# Patient Record
Sex: Male | Born: 1965 | Race: White | Hispanic: No | Marital: Married | Smoking: Former smoker
Health system: Southern US, Community
[De-identification: ages and names within clinical notes are randomized; demographics above are authoritative.]

## PROBLEM LIST (undated history)

## (undated) DIAGNOSIS — K219 Gastro-esophageal reflux disease without esophagitis: Secondary | ICD-10-CM

## (undated) DIAGNOSIS — Z87442 Personal history of urinary calculi: Secondary | ICD-10-CM

## (undated) DIAGNOSIS — G4730 Sleep apnea, unspecified: Secondary | ICD-10-CM

## (undated) DIAGNOSIS — N289 Disorder of kidney and ureter, unspecified: Secondary | ICD-10-CM

## (undated) DIAGNOSIS — Z973 Presence of spectacles and contact lenses: Secondary | ICD-10-CM

## (undated) DIAGNOSIS — I10 Essential (primary) hypertension: Secondary | ICD-10-CM

## (undated) HISTORY — PX: TONSILLECTOMY: SUR1361

---

## 2010-10-16 ENCOUNTER — Ambulatory Visit
Admission: RE | Admit: 2010-10-16 | Discharge: 2010-10-16 | Disposition: A | Discharge status: Home / Self Care | Payer: Managed Care, Other (non HMO) | Source: Ambulatory Visit | Attending: *Deleted | Admitting: *Deleted

## 2010-10-16 ENCOUNTER — Other Ambulatory Visit: Payer: Self-pay | Admitting: *Deleted

## 2010-10-16 DIAGNOSIS — M25519 Pain in unspecified shoulder: Secondary | ICD-10-CM

## 2010-10-16 DIAGNOSIS — M79601 Pain in right arm: Secondary | ICD-10-CM

## 2010-10-30 ENCOUNTER — Other Ambulatory Visit: Payer: Self-pay | Admitting: Emergency Medicine

## 2010-10-30 DIAGNOSIS — M5020 Other cervical disc displacement, unspecified cervical region: Secondary | ICD-10-CM

## 2010-11-03 ENCOUNTER — Ambulatory Visit
Admission: RE | Admit: 2010-11-03 | Discharge: 2010-11-03 | Disposition: A | Discharge status: Home / Self Care | Payer: Private Health Insurance - Indemnity | Source: Ambulatory Visit | Attending: *Deleted | Admitting: *Deleted

## 2010-11-03 DIAGNOSIS — M5020 Other cervical disc displacement, unspecified cervical region: Secondary | ICD-10-CM

## 2016-01-13 ENCOUNTER — Ambulatory Visit
Admission: RE | Admit: 2016-01-13 | Discharge: 2016-01-13 | Disposition: A | Discharge status: Home / Self Care | Payer: Managed Care, Other (non HMO) | Source: Ambulatory Visit | Attending: Emergency Medicine | Admitting: Emergency Medicine

## 2016-01-13 ENCOUNTER — Other Ambulatory Visit: Payer: Self-pay | Admitting: Emergency Medicine

## 2016-01-13 DIAGNOSIS — M25562 Pain in left knee: Secondary | ICD-10-CM

## 2017-05-17 ENCOUNTER — Other Ambulatory Visit: Payer: Self-pay | Admitting: Podiatry

## 2017-05-17 ENCOUNTER — Ambulatory Visit: Payer: Commercial Managed Care - PPO | Admitting: Podiatry

## 2017-05-17 ENCOUNTER — Ambulatory Visit (INDEPENDENT_AMBULATORY_CARE_PROVIDER_SITE_OTHER): Payer: Commercial Managed Care - PPO

## 2017-05-17 DIAGNOSIS — M7662 Achilles tendinitis, left leg: Secondary | ICD-10-CM

## 2017-05-17 DIAGNOSIS — M2042 Other hammer toe(s) (acquired), left foot: Secondary | ICD-10-CM

## 2017-05-17 MED ORDER — METHYLPREDNISOLONE 4 MG PO TBPK
ORAL_TABLET | ORAL | 0 refills | Status: DC
Start: 2017-05-17 — End: 2017-06-14

## 2017-05-17 MED ORDER — MELOXICAM 15 MG PO TABS: 15.0000 mg | ORAL_TABLET | Freq: Every day | ORAL | 3 refills | Status: DC

## 2017-05-17 NOTE — Progress Notes (Signed)
  Subjective:  Patient ID: Danny Mendez, male    DOB: 24-Oct-1965,  MRN: [id number] HPI Chief Complaint  Patient presents with  . Mass    L back heel x 3 mo; 7/10 intermittent pain Tx: none Pt. stated," it hurts when bearing weight."    52 y.o. male presents with the above complaint.     No past medical history on file.  No current outpatient medications on file.  Allergies  Allergen Reactions  . Niacin And Related Other (See Comments)    Pt. Stated," makes me pass out."   Review of Systems  All other systems reviewed and are negative.  Objective:  There were no vitals filed for this visit.  General: Well developed, nourished, in no acute distress, alert and oriented x3   Dermatological: Skin is warm, dry and supple bilateral. Nails x 10 are well maintained; remaining integument appears unremarkable at this time. There are no open sores, no preulcerative lesions, no rash or signs of infection present.  Vascular: Dorsalis Pedis artery and Posterior Tibial artery pedal pulses are 2/4 bilateral with immedate capillary fill time. Pedal hair growth present. No varicosities and no lower extremity edema present bilateral.   Neruologic: Grossly intact via light touch bilateral. Vibratory intact via tuning fork bilateral. Protective threshold with Semmes Wienstein monofilament intact to all pedal sites bilateral. Patellar and Achilles deep tendon reflexes 2+ bilateral. No Babinski or clonus noted bilateral.   Musculoskeletal: No gross boney pedal deformities bilateral. No pain, crepitus, or limitation noted with foot and ankle range of motion bilateral. Muscular strength 5/5 in all groups tested bilateral.  Gait: Unassisted, Nonantalgic.    Radiographs:  3 views left foot taken today demonstrates an osseously mature individual with a retrocalcaneal heel spur and thickening of the Achilles at its insertion site.  Also there appears to be soft tissue swelling overlying the spur most  likely bursitis.  Otherwise no acute findings are noted.  Assessment & Plan:   Assessment: Bursitis and Achilles tendinitis with retrocalcaneal heel spur left.  Plan: Discussed etiology pathology conservative versus surgical therapies.  At this point I injected the bursa with 2 mg of dexamethasone after sterile Betadine skin prep.  He tolerated procedure well.  He is unable to wear a Cam boot at work so we placed him in a plantar fascial brace to keep his Achilles stretched.  Also start him on a Medrol Dosepak to be followed by meloxicam.  Discussed appropriate shoe gear stretching exercises and ice therapy.  Dispensed his prescriptions as well as prescription and paperwork for the stretching.     Max T. Purty RockHyatt, [place] Dakota[place]PM

## 2017-05-17 NOTE — Patient Instructions (Signed)

## 2017-06-14 ENCOUNTER — Encounter: Payer: Self-pay | Admitting: Podiatry

## 2017-06-14 ENCOUNTER — Ambulatory Visit: Payer: Commercial Managed Care - PPO | Admitting: Podiatry

## 2017-06-14 DIAGNOSIS — M7662 Achilles tendinitis, left leg: Secondary | ICD-10-CM

## 2017-06-14 MED ORDER — MELOXICAM 15 MG PO TABS: 15.0000 mg | ORAL_TABLET | Freq: Every day | ORAL | 3 refills | Status: DC

## 2017-06-14 NOTE — Progress Notes (Signed)
He presents today for follow-up of his Achilles tendinitis of his left foot.  States that it is 100% improved he has absolutely no pain whatsoever.  Objective: Vital signs are stable he is alert and oriented x3 continues to take his anti-inflammatory and use his plantar fascial boot.  He has no tenderness on palpation of the Achilles tendon as it inserts on a large nonpulsatile nodule the posterior aspect of his left heel.  No tenderness on dorsiflexion of the Achilles.  Assessment: Resolved Achilles tendinitis bursitis left heel.  Plan: Encouraged him to continue conservative therapies including night splint and anti-inflammatories at least for the next month should there be recurrence he will notify us immediately.

## 2019-07-06 ENCOUNTER — Ambulatory Visit: Payer: Self-pay | Attending: Internal Medicine

## 2019-07-06 DIAGNOSIS — Z23 Encounter for immunization: Secondary | ICD-10-CM

## 2019-07-06 NOTE — Progress Notes (Signed)
   Covid-19 Vaccination Clinic  Name:  Danny Mendez    MRN: [id number] DOB: Sep 22, 1965  07/06/2019  Danny Mendez was observed post Covid-19 immunization for 15 minutes without incident. He was provided with Vaccine Information Sheet and instruction to access the V-Safe system.   Danny Mendez was instructed to call 911 with any severe reactions post vaccine: Marland Kitchen Difficulty breathing  . Swelling of face and throat  . A fast heartbeat  . A bad rash all over body  . Dizziness and weakness   Immunizations Administered    Name Date Dose VIS Date Route   Pfizer COVID-19 Vaccine 07/06/2019  3:10 PM 0.3 mL 03/02/2019 Intramuscular   Manufacturer: [employer], [employer]   Lot: [id number]   NDC: [address]-6

## 2019-07-30 ENCOUNTER — Ambulatory Visit: Payer: Self-pay | Attending: Internal Medicine

## 2019-07-30 DIAGNOSIS — Z23 Encounter for immunization: Secondary | ICD-10-CM

## 2019-07-30 NOTE — Progress Notes (Signed)
   Covid-19 Vaccination Clinic  Name:  Danny Mendez    MRN: [id number] DOB: [birth year]  07/30/2019  Danny Mendez was observed post Covid-19 immunization for 15 minutes without incident. He was provided with Vaccine Information Sheet and instruction to access the V-Safe system.   Danny Mendez was instructed to call 911 with any severe reactions post vaccine: Marland Kitchen Difficulty breathing  . Swelling of face and throat  . A fast heartbeat  . A bad rash all over body  . Dizziness and weakness   Immunizations Administered    Name Date Dose VIS Date Route   Pfizer COVID-19 Vaccine 07/30/2019  3:01 PM 0.3 mL 05/16/2018 Intramuscular   Manufacturer: [employer], [employer]   Lot: [id number]   NDC: [address]-9

## 2019-10-18 ENCOUNTER — Encounter: Payer: Self-pay | Admitting: Podiatry

## 2019-10-18 ENCOUNTER — Other Ambulatory Visit: Payer: Self-pay

## 2019-10-18 ENCOUNTER — Ambulatory Visit (INDEPENDENT_AMBULATORY_CARE_PROVIDER_SITE_OTHER): Payer: Commercial Managed Care - PPO

## 2019-10-18 ENCOUNTER — Ambulatory Visit: Payer: Commercial Managed Care - PPO | Admitting: Podiatry

## 2019-10-18 DIAGNOSIS — M7662 Achilles tendinitis, left leg: Secondary | ICD-10-CM | POA: Diagnosis not present

## 2019-10-18 MED ORDER — MELOXICAM 15 MG PO TABS: 15.0000 mg | ORAL_TABLET | Freq: Every day | ORAL | 3 refills | Status: DC

## 2019-10-18 MED ORDER — METHYLPREDNISOLONE 4 MG PO TBPK: ORAL_TABLET | ORAL | 0 refills | Status: DC

## 2019-10-18 NOTE — Progress Notes (Signed)
He presents today after having not seen him since 2019 with a chief complaint of pain to the posterior aspect of his left heel again.  Last time he was in he had insertional Achilles tendinitis and bursitis he states that it feels exactly the same he has tried ibuprofen occasionally but the pain is severe.  He states that it seems to worsen with his physical activities particularly martial arts.  Objective: Vital signs are stable alert oriented x3.  Pulses are palpable.  Large nonpulsatile mass to the posterior aspect of the heel exquisitely tender on palpation and there is fluctuance with beneath the skin.  There is no signs of infection.  Radiographs taken today demonstrates thickening of his Achilles is been as it comes down the leg there is no calf pain on evaluation of this.  He does have a retrocalcaneal heel spur and some soft tissue swelling of the subcutaneous tissue posteriorly.  Assessment: Insertional Achilles tendinitis and bursitis with a retrocalcaneal heel spur.  Plan: Discussed etiology pathology conservative surgical therapies at this point we did discuss the possible need for surgical intervention and and or imaging.  We also discussed injecting the bursa today with 2 mg of dexamethasone which we did start him on a Medrol Dosepak and switching him to meloxicam afterwards.  He will continue to utilize his night splint which she has at home I provided him with an ice pack and instructions to lay off on his high impact activities.  Follow-up with him in 1 month.

## 2019-10-22 DIAGNOSIS — M79676 Pain in unspecified toe(s): Secondary | ICD-10-CM

## 2019-11-15 ENCOUNTER — Ambulatory Visit: Payer: Commercial Managed Care - PPO | Admitting: Podiatry

## 2019-11-15 ENCOUNTER — Encounter: Payer: Self-pay | Admitting: Podiatry

## 2019-11-15 ENCOUNTER — Other Ambulatory Visit: Payer: Self-pay

## 2019-11-15 DIAGNOSIS — M7662 Achilles tendinitis, left leg: Secondary | ICD-10-CM

## 2019-11-15 MED ORDER — METHYLPREDNISOLONE 4 MG PO TBPK: ORAL_TABLET | ORAL | 0 refills | Status: DC

## 2019-11-15 NOTE — Progress Notes (Signed)
He presents today for follow-up of his Achilles tendinitis left.  He states that is better than it was by about 50% but is still not where I want it.  Objective: Vital signs are stable alert oriented x3 still has a palpable nonpulsatile fluctuant mass on the posterior aspect of the left calcaneus.  Radiographs were reviewed with him in detail today demonstrating inflamed Achilles tendon bursitis as well as a retrocalcaneal heel spur.  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  Plan: I also injected the bursa today with the 2 mg of dexamethasone local anesthetic started him back on a Medrol Dosepak and I will follow-up with him in approximately 1 month if not improved we will perform an MRI at that time.  We did discuss surgery today in great detail.

## 2019-12-13 ENCOUNTER — Encounter: Payer: Self-pay | Admitting: Podiatry

## 2019-12-13 ENCOUNTER — Ambulatory Visit (INDEPENDENT_AMBULATORY_CARE_PROVIDER_SITE_OTHER): Payer: Commercial Managed Care - PPO | Admitting: Podiatry

## 2019-12-13 ENCOUNTER — Other Ambulatory Visit: Payer: Self-pay

## 2019-12-13 DIAGNOSIS — S86012A Strain of left Achilles tendon, initial encounter: Secondary | ICD-10-CM

## 2019-12-13 DIAGNOSIS — M7662 Achilles tendinitis, left leg: Secondary | ICD-10-CM | POA: Diagnosis not present

## 2019-12-13 NOTE — Progress Notes (Signed)
He presents today for follow-up of his Achilles tendinitis of his left foot.  He states that is better but he still limping and he is learned how to limp so that the Achilles does not hurt so badly.  He says but is starting to hurt from the knee down at this point.  Objective: Vital signs are stable he is alert oriented x3 he still has severe pain on palpation of the posterior superior aspect of his calcaneus at the insertion site of his Achilles.  There is obviously a large retrocalcaneal heel spur is demonstrated on radiograph previously.  This time has not resolved and seems to be worse if anything it is warm to the touch mildly erythematous.  Assessment: Insertional Achilles tendinitis retrocalcaneal heel spur.  Plan: At this point we are requesting an MRI for surgical evaluation conservative therapies have failed to alleviate his symptoms.

## 2019-12-14 NOTE — Addendum Note (Signed)
Addended by: Lottie Rater E on: 12/14/2019 11:03 AM   Modules accepted: Orders

## 2019-12-18 ENCOUNTER — Other Ambulatory Visit: Payer: Self-pay | Admitting: Podiatry

## 2019-12-21 ENCOUNTER — Other Ambulatory Visit: Payer: Self-pay

## 2019-12-21 ENCOUNTER — Ambulatory Visit
Admission: RE | Admit: 2019-12-21 | Discharge: 2019-12-21 | Disposition: A | Discharge status: Home / Self Care | Payer: Commercial Managed Care - PPO | Source: Ambulatory Visit | Attending: Podiatry | Admitting: Podiatry

## 2019-12-21 DIAGNOSIS — M7662 Achilles tendinitis, left leg: Secondary | ICD-10-CM

## 2019-12-21 DIAGNOSIS — S86012A Strain of left Achilles tendon, initial encounter: Secondary | ICD-10-CM

## 2020-01-04 ENCOUNTER — Telehealth: Payer: Self-pay | Admitting: Podiatry

## 2020-01-04 NOTE — Telephone Encounter (Signed)
Patient wanting MRI results. Please advise.  

## 2020-01-09 ENCOUNTER — Telehealth: Payer: Self-pay | Admitting: Podiatry

## 2020-01-09 NOTE — Telephone Encounter (Signed)
Patient called in stating he hasn't heard anything from [facility] regarding results of MRI. Patient was scheduled today for follow up, stated they wasn't scheduled and MRI was complete on 12/21/19.

## 2020-01-17 ENCOUNTER — Ambulatory Visit: Payer: Commercial Managed Care - PPO | Admitting: Podiatry

## 2020-01-17 ENCOUNTER — Other Ambulatory Visit: Payer: Self-pay

## 2020-01-17 ENCOUNTER — Encounter: Payer: Self-pay | Admitting: Podiatry

## 2020-01-17 DIAGNOSIS — S86312A Strain of muscle(s) and tendon(s) of peroneal muscle group at lower leg level, left leg, initial encounter: Secondary | ICD-10-CM | POA: Diagnosis not present

## 2020-01-17 DIAGNOSIS — S86012D Strain of left Achilles tendon, subsequent encounter: Secondary | ICD-10-CM | POA: Diagnosis not present

## 2020-01-19 NOTE — Progress Notes (Signed)
He presents today for follow-up of his painful foot left.  He states that is really not getting any better and is affecting my ability to do my daily activities including work.  He states that this cannot continue on like this.  He would like to discuss the MRI and surgical options.  Objective: Vital signs are stable he is alert and oriented x3 I have reviewed his past medical history medications allergies surgeries and social history.  At this point pulses are palpable bilateral still has severe pain on palpation of his left heel.  MRI demonstrates a 1cm Achilles tendon tear at its insertion with no retraction at this point.  He also has a peroneus brevis with a split tear as well as the peroneus longus with extensive tearing laterally.  Assessment: Achilles and peroneal tendon tears.  Plan: Discussed etiology pathology conservative versus surgical therapies.  At this point we went over surgery in great detail today.  We did discuss a gastroc recession as well as an Achilles tenolysis a retrocalcaneal heel spur resection and repair primary repair of the peroneal tendons left with cast application.  We discussed all of this in great detail today we discussed the possible side effects and consequences which may include but not limited to postop pain bleeding swelling infection recurrence need for further surgery overcorrection under correction loss of digit loss of limb loss of life.  He understands this and is acceptable to him.  We provided him with a consent form today which she signed we also discussed the surgery center anesthesia group as well as instructions for the morning of surgery.  I will follow-up with him in the near future for surgical intervention.

## 2020-02-19 ENCOUNTER — Emergency Department (HOSPITAL_BASED_OUTPATIENT_CLINIC_OR_DEPARTMENT_OTHER): Payer: Commercial Managed Care - PPO

## 2020-02-19 ENCOUNTER — Other Ambulatory Visit: Payer: Self-pay

## 2020-02-19 ENCOUNTER — Emergency Department (HOSPITAL_BASED_OUTPATIENT_CLINIC_OR_DEPARTMENT_OTHER)
Admission: EM | Admit: 2020-02-19 | Discharge: 2020-02-19 | Disposition: A | Discharge status: Home / Self Care | Payer: Commercial Managed Care - PPO | Attending: Emergency Medicine | Admitting: Emergency Medicine

## 2020-02-19 ENCOUNTER — Encounter (HOSPITAL_BASED_OUTPATIENT_CLINIC_OR_DEPARTMENT_OTHER): Payer: Self-pay | Admitting: *Deleted

## 2020-02-19 DIAGNOSIS — N132 Hydronephrosis with renal and ureteral calculous obstruction: Secondary | ICD-10-CM | POA: Diagnosis not present

## 2020-02-19 DIAGNOSIS — N200 Calculus of kidney: Secondary | ICD-10-CM

## 2020-02-19 DIAGNOSIS — R109 Unspecified abdominal pain: Secondary | ICD-10-CM | POA: Diagnosis present

## 2020-02-19 HISTORY — DX: Disorder of kidney and ureter, unspecified: N28.9

## 2020-02-19 LAB — COMPREHENSIVE METABOLIC PANEL
ALT: 26 U/L (ref 0–44)
AST: 23 U/L (ref 15–41)
Albumin: 4 g/dL (ref 3.5–5.0)
Alkaline Phosphatase: 47 U/L (ref 38–126)
Anion gap: 9 (ref 5–15)
BUN: 20 mg/dL (ref 6–20)
CO2: 24 mmol/L (ref 22–32)
Calcium: 9 mg/dL (ref 8.9–10.3)
Chloride: 103 mmol/L (ref 98–111)
Creatinine, Ser: 1.17 mg/dL (ref 0.61–1.24)
GFR, Estimated: >60 mL/min (ref >60)
Glucose, Bld: 178 mg/dL — ABNORMAL HIGH (ref 70–99)
Potassium: 3.7 mmol/L (ref 3.5–5.1)
Sodium: 136 mmol/L (ref 135–145)
Total Bilirubin: 0.5 mg/dL (ref 0.3–1.2)
Total Protein: 7.1 g/dL (ref 6.5–8.1)

## 2020-02-19 LAB — CBC WITH DIFFERENTIAL/PLATELET
Abs Immature Granulocytes: 0.04 10*3/uL (ref 0.00–0.07)
Basophils Absolute: 0 10*3/uL (ref 0.0–0.1)
Basophils Relative: 0 %
Eosinophils Absolute: 0.1 10*3/uL (ref 0.0–0.5)
Eosinophils Relative: 1 %
HCT: 42.6 % (ref 39.0–52.0)
Hemoglobin: 15.1 g/dL (ref 13.0–17.0)
Immature Granulocytes: 1 %
Lymphocytes Relative: 16 %
Lymphs Abs: 1.3 10*3/uL (ref 0.7–4.0)
MCH: 31.7 pg (ref 26.0–34.0)
MCHC: 35.4 g/dL (ref 30.0–36.0)
MCV: 89.5 fL (ref 80.0–100.0)
Monocytes Absolute: 0.4 10*3/uL (ref 0.1–1.0)
Monocytes Relative: 5 %
Neutro Abs: 6.4 10*3/uL (ref 1.7–7.7)
Neutrophils Relative %: 77 %
Platelets: 189 10*3/uL (ref 150–400)
RBC: 4.76 MIL/uL (ref 4.22–5.81)
RDW: 12 % (ref 11.5–15.5)
WBC: 8.3 10*3/uL (ref 4.0–10.5)
nRBC: 0 % (ref 0.0–0.2)

## 2020-02-19 LAB — URINALYSIS, ROUTINE W REFLEX MICROSCOPIC
Bilirubin Urine: NEGATIVE
Glucose, UA: 100 mg/dL — AB
Ketones, ur: NEGATIVE mg/dL
Leukocytes,Ua: NEGATIVE
Nitrite: NEGATIVE
Protein, ur: NEGATIVE mg/dL
Specific Gravity, Urine: 1.02 (ref 1.005–1.030)
pH: 6.5 (ref 5.0–8.0)

## 2020-02-19 LAB — URINALYSIS, MICROSCOPIC (REFLEX): RBC / HPF: >50 RBC/hpf (ref 0–5)

## 2020-02-19 MED ORDER — TAMSULOSIN HCL 0.4 MG PO CAPS
0.4000 mg | ORAL_CAPSULE | Freq: Every day | ORAL | 0 refills | Status: DC
Start: 2020-02-19 — End: 2020-02-21

## 2020-02-19 MED ORDER — KETOROLAC TROMETHAMINE 30 MG/ML IJ SOLN
30.0000 mg | Freq: Once | INTRAMUSCULAR | Status: AC
  Administered 2020-02-19: 30 mg via INTRAVENOUS
  Filled 2020-02-19: qty 1

## 2020-02-19 MED ORDER — ONDANSETRON HCL 4 MG/2ML IJ SOLN
4.0000 mg | Freq: Once | INTRAMUSCULAR | Status: AC
  Administered 2020-02-19: 4 mg via INTRAVENOUS
  Filled 2020-02-19: qty 2

## 2020-02-19 MED ORDER — OXYCODONE-ACETAMINOPHEN 5-325 MG PO TABS
1.0000 | ORAL_TABLET | Freq: Four times a day (QID) | ORAL | 0 refills | Status: DC | PRN
Start: 2020-02-19 — End: 2020-02-21

## 2020-02-19 NOTE — ED Provider Notes (Signed)
[facility] EMERGENCY DEPARTMENT Provider Note   CSN: [id number] Arrival date & time: 02/19/20  6734     History Chief Complaint  Patient presents with  . Flank Pain    Danny Mendez is a 54 y.o. male.  Patient is a 54 year old male with history of renal calculi.  He presents today for evaluation of left flank pain.  This started approximately 2 to 3 hours prior to presentation.  He describes sudden onset of left flank pain radiating to his left groin.  He has felt nauseated, but did not vomit.  He denies any bowel or bladder complaints.  He denies any fevers or chills.  The history is provided by the patient.  Flank Pain This is a new problem. The current episode started 1 to 2 hours ago. The problem occurs constantly. The problem has been rapidly worsening. Nothing aggravates the symptoms. Nothing relieves the symptoms. He has tried nothing for the symptoms.       Past Medical History:  Diagnosis Date  . Renal disorder     There are no problems to display for this patient.   History reviewed. No pertinent surgical history.     No family history on file.  Social History   Tobacco Use  . Smoking status: Unknown If Ever Smoked  . Smokeless tobacco: Never Used  Substance Use Topics  . Alcohol use: Not on file  . Drug use: Not on file    Home Medications Prior to Admission medications   Medication Sig Start Date End Date Taking? Authorizing Provider  ketoconazole (NIZORAL) 2 % cream Apply topically daily. 10/30/19   [provider]  meloxicam (MOBIC) 15 MG tablet Take 1 tablet (15 mg total) by mouth daily. 10/18/19   Hyatt, Max T, DPM    Allergies    Niacin and related  Review of Systems   Review of Systems  Genitourinary: Positive for flank pain.  All other systems reviewed and are negative.   Physical Exam Updated Vital Signs BP 139/90 (BP Location: Right Arm)   Pulse 74   Temp 97.6 F (36.4 C)   Ht 5\' 9"  (1.753 m)   Wt 83.9 kg    SpO2 100%   BMI 27.32 kg/m   Physical Exam Vitals and nursing note reviewed.  Constitutional:      General: He is not in acute distress.    Appearance: He is well-developed. He is not diaphoretic.  HENT:     Head: Normocephalic and atraumatic.  Cardiovascular:     Rate and Rhythm: Normal rate and regular rhythm.     Heart sounds: No murmur heard.  No friction rub.  Pulmonary:     Effort: Pulmonary effort is normal. No respiratory distress.     Breath sounds: Normal breath sounds. No wheezing or rales.  Abdominal:     General: Bowel sounds are normal. There is no distension.     Palpations: Abdomen is soft.     Tenderness: There is no abdominal tenderness. There is left CVA tenderness. There is no right CVA tenderness, guarding or rebound.  Musculoskeletal:        General: Normal range of motion.     Cervical back: Normal range of motion and neck supple.  Skin:    General: Skin is warm and dry.  Neurological:     Mental Status: He is alert and oriented to person, place, and time.     Coordination: Coordination normal.     ED Results / Procedures /  Treatments   Labs (all labs ordered are listed, but only abnormal results are displayed) Labs Reviewed  URINALYSIS, ROUTINE W REFLEX MICROSCOPIC  CBC WITH DIFFERENTIAL/PLATELET  COMPREHENSIVE METABOLIC PANEL    EKG None  Radiology No results found.  Procedures Procedures (including critical care time)  Medications Ordered in ED Medications  ketorolac (TORADOL) 30 MG/ML injection 30 mg (has no administration in time range)  ondansetron (ZOFRAN) injection 4 mg (has no administration in time range)    ED Course  I have reviewed the triage vital signs and the nursing notes.  Pertinent labs & imaging results that were available during my care of the patient were reviewed by me and considered in my medical decision making (see chart for details).    MDM Rules/Calculators/A&P  Patient presenting here with left  flank pain caused by an 8 mm calculus at the left UPJ.  Patient is afebrile with stable vital signs.  He has no sign of infection in his urine.  He is feeling better after receiving medicine here in the ER.  Patient seems appropriate for discharge.  He will be given pain medication and is to follow-up with urology as I feel the stone is likely not to pass.  He also has 2 additional large stones noted in the left renal system.  Final Clinical Impression(s) / ED Diagnoses Final diagnoses:  None    Rx / DC Orders ED Discharge Orders    None       Geoffery Lyons, MD 02/19/20 1141

## 2020-02-19 NOTE — Discharge Instructions (Signed)
Begin taking Flomax as prescribed.  Take Percocet as prescribed as needed for pain.  Follow-up with [facility] in the next few days.  Their contact information has been provided in this discharge summary for you to call and make these arrangements.  Return to the ER in the meantime if you develop worsening pain, high fever, or other new and concerning symptoms.

## 2020-02-19 NOTE — ED Notes (Signed)
Pt unable to provide urine sample at this time 

## 2020-02-19 NOTE — ED Triage Notes (Signed)
C/o left flank pain onset this am nausea,  Unsure if blood in urine,  Took 400 IBU 1 hr pta

## 2020-02-19 NOTE — ED Notes (Signed)
Patient transported to CT 

## 2020-02-21 ENCOUNTER — Other Ambulatory Visit: Payer: Self-pay

## 2020-02-21 ENCOUNTER — Encounter (HOSPITAL_COMMUNITY): Payer: Self-pay | Admitting: Urology

## 2020-02-21 ENCOUNTER — Other Ambulatory Visit: Payer: Self-pay | Admitting: Urology

## 2020-02-21 ENCOUNTER — Inpatient Hospital Stay (HOSPITAL_COMMUNITY): Payer: Commercial Managed Care - PPO | Admitting: Anesthesiology

## 2020-02-21 ENCOUNTER — Observation Stay (HOSPITAL_COMMUNITY)
Admission: AD | Admit: 2020-02-21 | Discharge: 2020-02-22 | Disposition: A | Discharge status: Home / Self Care | Payer: Commercial Managed Care - PPO | Source: Ambulatory Visit | Attending: Urology | Admitting: Urology

## 2020-02-21 ENCOUNTER — Encounter (HOSPITAL_COMMUNITY)
Admission: AD | Disposition: A | Discharge status: Home / Self Care | Payer: Self-pay | Source: Ambulatory Visit | Attending: Urology

## 2020-02-21 ENCOUNTER — Inpatient Hospital Stay (HOSPITAL_COMMUNITY): Payer: Commercial Managed Care - PPO

## 2020-02-21 DIAGNOSIS — R109 Unspecified abdominal pain: Secondary | ICD-10-CM | POA: Diagnosis present

## 2020-02-21 DIAGNOSIS — Z87891 Personal history of nicotine dependence: Secondary | ICD-10-CM | POA: Insufficient documentation

## 2020-02-21 DIAGNOSIS — Z20822 Contact with and (suspected) exposure to covid-19: Secondary | ICD-10-CM | POA: Diagnosis not present

## 2020-02-21 DIAGNOSIS — N201 Calculus of ureter: Secondary | ICD-10-CM | POA: Diagnosis not present

## 2020-02-21 HISTORY — PX: CYSTOSCOPY WITH RETROGRADE PYELOGRAM, URETEROSCOPY AND STENT PLACEMENT: SHX5789

## 2020-02-21 HISTORY — DX: Personal history of urinary calculi: Z87.442

## 2020-02-21 LAB — SARS CORONAVIRUS 2 BY RT PCR (HOSPITAL ORDER, PERFORMED IN ~~LOC~~ HOSPITAL LAB): SARS Coronavirus 2: NEGATIVE

## 2020-02-21 SURGERY — CYSTOURETEROSCOPY, WITH RETROGRADE PYELOGRAM AND STENT INSERTION
Anesthesia: General | Laterality: Left

## 2020-02-21 MED ORDER — ONDANSETRON HCL 4 MG/2ML IJ SOLN
INTRAMUSCULAR | Status: DC | PRN
  Administered 2020-02-21: 4 mg via INTRAVENOUS

## 2020-02-21 MED ORDER — FENTANYL CITRATE (PF) 100 MCG/2ML IJ SOLN: 25.0000 ug | INTRAMUSCULAR | Status: DC | PRN

## 2020-02-21 MED ORDER — DEXAMETHASONE SODIUM PHOSPHATE 10 MG/ML IJ SOLN
INTRAMUSCULAR | Status: DC | PRN
  Administered 2020-02-21: 10 mg via INTRAVENOUS

## 2020-02-21 MED ORDER — CHLORHEXIDINE GLUCONATE 0.12 % MT SOLN
15.0000 mL | Freq: Once | OROMUCOSAL | Status: AC
  Administered 2020-02-21: 15 mL via OROMUCOSAL

## 2020-02-21 MED ORDER — SODIUM CHLORIDE 0.9 % IV SOLN
2.0000 g | INTRAVENOUS | Status: AC
  Administered 2020-02-21: 2 g via INTRAVENOUS
  Filled 2020-02-21: qty 20

## 2020-02-21 MED ORDER — HYDROCODONE-ACETAMINOPHEN 5-325 MG PO TABS
1.0000 | ORAL_TABLET | ORAL | Status: DC | PRN
  Administered 2020-02-21: 2 via ORAL
  Filled 2020-02-21: qty 2

## 2020-02-21 MED ORDER — HYDROMORPHONE HCL 1 MG/ML IJ SOLN
0.5000 mg | INTRAMUSCULAR | Status: DC | PRN
  Administered 2020-02-21: 0.5 mg via INTRAVENOUS
  Filled 2020-02-21: qty 1

## 2020-02-21 MED ORDER — MIDAZOLAM HCL 2 MG/2ML IJ SOLN
INTRAMUSCULAR | Status: AC
  Filled 2020-02-21: qty 2

## 2020-02-21 MED ORDER — STERILE WATER FOR IRRIGATION IR SOLN
Status: DC | PRN
  Administered 2020-02-21: 3000 mL

## 2020-02-21 MED ORDER — PROPOFOL 10 MG/ML IV BOLUS
INTRAVENOUS | Status: AC
  Filled 2020-02-21: qty 20

## 2020-02-21 MED ORDER — PROMETHAZINE HCL 25 MG/ML IJ SOLN: 6.2500 mg | INTRAMUSCULAR | Status: DC | PRN

## 2020-02-21 MED ORDER — DEXAMETHASONE SODIUM PHOSPHATE 10 MG/ML IJ SOLN
INTRAMUSCULAR | Status: AC
  Filled 2020-02-21: qty 1

## 2020-02-21 MED ORDER — PROPOFOL 10 MG/ML IV BOLUS
INTRAVENOUS | Status: DC | PRN
  Administered 2020-02-21: 200 mg via INTRAVENOUS

## 2020-02-21 MED ORDER — FENTANYL CITRATE (PF) 100 MCG/2ML IJ SOLN
INTRAMUSCULAR | Status: AC
  Filled 2020-02-21: qty 2

## 2020-02-21 MED ORDER — ONDANSETRON HCL 4 MG/2ML IJ SOLN
INTRAMUSCULAR | Status: AC
  Filled 2020-02-21: qty 2

## 2020-02-21 MED ORDER — MIDAZOLAM HCL 5 MG/5ML IJ SOLN
INTRAMUSCULAR | Status: DC | PRN
  Administered 2020-02-21: 2 mg via INTRAVENOUS

## 2020-02-21 MED ORDER — IOHEXOL 300 MG/ML  SOLN
INTRAMUSCULAR | Status: DC | PRN
  Administered 2020-02-21: 7 mL

## 2020-02-21 MED ORDER — SODIUM CHLORIDE 0.9 % IV SOLN: 2.0000 g | INTRAVENOUS | Status: DC

## 2020-02-21 MED ORDER — FENTANYL CITRATE (PF) 100 MCG/2ML IJ SOLN
INTRAMUSCULAR | Status: DC | PRN
  Administered 2020-02-21: 25 ug via INTRAVENOUS

## 2020-02-21 MED ORDER — LIDOCAINE 2% (20 MG/ML) 5 ML SYRINGE
INTRAMUSCULAR | Status: DC | PRN
  Administered 2020-02-21: 100 mg via INTRAVENOUS

## 2020-02-21 MED ORDER — KCL IN DEXTROSE-NACL 20-5-0.45 MEQ/L-%-% IV SOLN
INTRAVENOUS | Status: DC
  Filled 2020-02-21 (×3): qty 1000

## 2020-02-21 MED ORDER — SODIUM CHLORIDE 0.9 % IR SOLN: Status: DC | PRN

## 2020-02-21 MED ORDER — OXYBUTYNIN CHLORIDE 5 MG PO TABS: 5.0000 mg | ORAL_TABLET | Freq: Three times a day (TID) | ORAL | Status: DC | PRN

## 2020-02-21 MED ORDER — LACTATED RINGERS IV SOLN: INTRAVENOUS | Status: DC

## 2020-02-21 MED ORDER — ONDANSETRON HCL 4 MG/2ML IJ SOLN: 4.0000 mg | INTRAMUSCULAR | Status: DC | PRN

## 2020-02-21 MED ORDER — KETOROLAC TROMETHAMINE 30 MG/ML IJ SOLN: 30.0000 mg | Freq: Once | INTRAMUSCULAR | Status: DC | PRN

## 2020-02-21 MED ORDER — TAMSULOSIN HCL 0.4 MG PO CAPS
0.4000 mg | ORAL_CAPSULE | Freq: Every day | ORAL | Status: DC
  Administered 2020-02-21 – 2020-02-22 (×2): 0.4 mg via ORAL
  Filled 2020-02-21 (×2): qty 1

## 2020-02-21 MED ORDER — LIDOCAINE HCL (PF) 2 % IJ SOLN
INTRAMUSCULAR | Status: AC
  Filled 2020-02-21: qty 5

## 2020-02-21 SURGICAL SUPPLY — 22 items
BAG URO CATCHER STRL LF (MISCELLANEOUS) ×3 IMPLANT
BASKET ZERO TIP NITINOL 2.4FR (BASKET) IMPLANT
BSKT STON RTRVL ZERO TP 2.4FR (BASKET)
BULB IRRIG PATHFIND (MISCELLANEOUS) IMPLANT
CATH URET 5FR 28IN OPEN ENDED (CATHETERS) ×3 IMPLANT
CLOTH BEACON ORANGE TIMEOUT ST (SAFETY) ×3 IMPLANT
GLOVE BIOGEL M STRL SZ7.5 (GLOVE) ×3 IMPLANT
GOWN STRL REUS W/TWL XL LVL3 (GOWN DISPOSABLE) ×3 IMPLANT
GUIDEWIRE ANG ZIPWIRE 038X150 (WIRE) IMPLANT
GUIDEWIRE STR DUAL SENSOR (WIRE) ×3 IMPLANT
KIT TURNOVER KIT A (KITS) IMPLANT
LASER FIB FLEXIVA PULSE ID 365 (Laser) IMPLANT
MANIFOLD NEPTUNE II (INSTRUMENTS) ×3 IMPLANT
PACK CYSTO (CUSTOM PROCEDURE TRAY) ×3 IMPLANT
SHEATH URETERAL 12FRX35CM (MISCELLANEOUS) IMPLANT
STENT URET 6FRX26 CONTOUR (STENTS) ×3 IMPLANT
SYR 20ML LL LF (SYRINGE) ×3 IMPLANT
TRACTIP FLEXIVA PULS ID 200XHI (Laser) IMPLANT
TRACTIP FLEXIVA PULSE ID 200 (Laser)
TUBING CONNECTING 10 (TUBING) ×2 IMPLANT
TUBING CONNECTING 10' (TUBING) ×1
TUBING UROLOGY SET (TUBING) IMPLANT

## 2020-02-21 NOTE — Op Note (Signed)
Operative report  Preoperative diagnosis: Left ureteral calculus with possible UTI versus impending urosepsis Postop diagnosis: Same Procedure: Cystoscopy, left retrograde pyelogram with intraoperative interpretation, insertion left JJ stent Surgeon: Benancio Deeds Anesthesia: General Estimated blood loss minimal Operative findings approximate 6 to 7 mm filling defect in the left proximal ureter consistent with stone seen on preoperative CT scan.  Able to place 6 [place] by 26 cm JJ stent without difficulty.  Hydronephrotic flow of clear urine through and around the stent noted.  Operative note: After obtaining for consent for the patient was taken the major cystoscopy suite placed under general anesthesia.  Placed in the dorsolithotomy position genitalia prepped and draped in usual sterile fashion.  Proper pause and timeout was performed for site of procedure.  21 French cystoscope was advanced into the bladder without difficulty.  Pendulous prostatic urethra appeared grossly normal.  Bladder appeared grossly normal and there was E flux of urine from both ureteral orifice ease which was clear.  A 5 French catheter was utilized to cannulate the left ureteral orifice and gentle retrograde confirmed filling defect in left proximal ureter consistent with a stone seen on preoperative CT scan.  The open tip catheter was advanced up just distal to the stone and a sensor wire was then passed easily through the open tip catheter around the stone up to the renal pelvis.  The open tip catheter was removed.  A 6 French by 26 cm JJ stent was placed leaving a proximal coil in the renal pelvis and a distal coil in the bladder.  There was brisk flow of clear urine through and around the stent noted.  Bladder was emptied procedure terminated.  He was awakened from anesthesia and in stable condition.  He was febrile during the case to approximately 101 F.  We will plan to keep overnight observation with antibiotics.

## 2020-02-21 NOTE — Anesthesia Procedure Notes (Signed)
Procedure Name: LMA Insertion Date/Time: 02/21/2020 4:32 PM Performed by: Florene Route, CRNA Patient Re-evaluated:Patient Re-evaluated prior to induction Oxygen Delivery Method: Circle system utilized Preoxygenation: Pre-oxygenation with 100% oxygen Induction Type: IV induction Ventilation: Mask ventilation without difficulty LMA: LMA inserted LMA Size: 4.0 Number of attempts: 1 Placement Confirmation: positive ETCO2 and breath sounds checked- equal and bilateral Tube secured with: Tape Dental Injury: Teeth and Oropharynx as per pre-operative assessment

## 2020-02-21 NOTE — Transfer of Care (Signed)
Immediate Anesthesia Transfer of Care Note  Patient: Danny Mendez  Procedure(s) Performed: CYSTOSCOPY WITH RETROGRADE PYELOGRAM AND STENT PLACEMENT (Left )  Patient Location: PACU  Anesthesia Type:General  Level of Consciousness: awake, drowsy and patient cooperative  Airway & Oxygen Therapy: Patient Spontanous Breathing and Patient connected to face mask oxygen  Post-op Assessment: Report given to RN and Post -op Vital signs reviewed and stable  Post vital signs: Reviewed and stable  Last Vitals:  Vitals Value Taken Time  BP 134/81 02/21/20 1657  Temp 36.8 C 02/21/20 1657  Pulse 81 02/21/20 1659  Resp 12 02/21/20 1659  SpO2 100 % 02/21/20 1659  Vitals shown include unvalidated device data.  Last Pain:  Vitals:   02/21/20 1334  TempSrc: Oral  PainSc: 6       Patients Stated Pain Goal: 4 (02/21/20 1334)  Complications: No complications documented.

## 2020-02-21 NOTE — H&P (Signed)
: cc: Urolithiasis   02/21/20: 54 year old man who developed acute onset flank pain found to have an 8 mm left UPJ calculus on imaging as well as to whether nonobstructing left renal calculi. Patient is febrile in the office today to 102.2. His urine does not look consistent with infection however he feels tired and has flank pain requiring oxycodone. He last had stones in 07-19-07 which he passed on his own. He has never had surgery for kidney stones before. He is scheduled for foot surgery on 03/07/2020.     ALLERGIES:     MEDICATIONS: No Reported Medications     GU PSH: None     PSH Notes: Encounter for contraceptive planning, No Surgical Problems   NON-GU PSH: None   GU PMH: History of urolithiasis, Nephrolithiasis - 2012-07-18 Renal calculus, Kidney stone on left side - 2012-07-18 Ureteral calculus, Distal Ureteral Stone On The Left - 07-18-2012      PMH Notes:  1898-03-22 00:00:00 - Note: Normal Routine History And Physical Adult   NON-GU PMH: None   FAMILY HISTORY: Death In The Family Father - Father Family Health Status Number - Father nephrolithiasis - Father, Mother   SOCIAL HISTORY: None    Notes: Tobacco Use, Marital History - Currently Married, Occupation:, Caffeine Use, Being A Social Drinker   REVIEW OF SYSTEMS:    GU Review Male:   Patient denies frequent urination, hard to postpone urination, burning/ pain with urination, get up at night to urinate, leakage of urine, stream starts and stops, trouble starting your stream, have to strain to urinate , erection problems, and penile pain.  Gastrointestinal (Upper):   Patient denies nausea, vomiting, and indigestion/ heartburn.  Gastrointestinal (Lower):   Patient denies diarrhea and constipation.  Constitutional:   Patient denies fever, night sweats, weight loss, and fatigue.  Skin:   Patient denies itching and skin rash/ lesion.  Eyes:   Patient denies blurred vision and double vision.  Ears/ Nose/ Throat:   Patient denies sore throat  and sinus problems.  Hematologic/Lymphatic:   Patient denies swollen glands and easy bruising.  Cardiovascular:   Patient denies leg swelling and chest pains.  Respiratory:   Patient denies cough and shortness of breath.  Endocrine:   Patient denies excessive thirst.  Musculoskeletal:   Patient denies back pain and joint pain.  Neurological:   Patient denies headaches and dizziness.  Psychologic:   Patient denies depression and anxiety.   VITAL SIGNS: None   MULTI-SYSTEM PHYSICAL EXAMINATION:    Constitutional: Well-nourished. No physical deformities. Normally developed. Good grooming.  Neck: Neck symmetrical, not swollen. Normal tracheal position.  Respiratory: No labored breathing, no use of accessory muscles.   Cardiovascular: Normal temperature  Skin: No paleness, no jaundice, no cyanosis. No lesion, no ulcer, no rash.  Neurologic / Psychiatric: Oriented to time, oriented to place, oriented to person. No depression, no anxiety, no agitation.  Gastrointestinal: No rigidity, non obese abdomen.   Eyes: Normal conjunctivae. Normal eyelids.  Ears, Nose, Mouth, and Throat: Left ear no scars, no lesions, no masses. Right ear no scars, no lesions, no masses. Nose no scars, no lesions, no masses. Normal hearing. Normal lips.  Musculoskeletal: Normal gait and station of head and neck.     Complexity of Data:  Records Review:   POC Tool  Urine Test Review:   Urinalysis  X-Ray Review: C.T. Abdomen/Pelvis: Reviewed Films. Reviewed Report. Discussed With Patient.     PROCEDURES:  Urinalysis Dipstick Dipstick Cont'd  Color: Amber Bilirubin: Neg mg/dL  Appearance: Clear Ketones: Neg mg/dL  Specific Gravity: 6.659 Blood: Neg ery/uL  pH: 6.0 Protein: Trace mg/dL  Glucose: Neg mg/dL Urobilinogen: 0.2 mg/dL    Nitrites: Neg    Leukocyte Esterase: Neg leu/uL         Ceftriaxone 1g - 93570, V7793 Qty: 1 Adm. By: Lissa Hoard McDougald  Unit: gram Lot No [id number]  Route: IM Exp. Date  03/22/2021  Freq: None Mfgr.:   Site: Left Buttock   ASSESSMENT:      ICD-10 Details  1 GU:   Ureteral calculus - N20.1 Acute, Uncomplicated  2   Renal calculus - N20.0 Chronic, Stable   PLAN:           Document Letter(s):  Created for Patient: Clinical Summary         Notes:   Reviewed patient's CT scan which showed 3 left-sided renal calculi 1 of which is at the UPJ causing obstruction. Patient is febrile today in the office and although urine does not appear infected he could have infection proximal to the stone. Recommend antibiotic injection in the office followed by urgent ureteral stent placement. I discussed staged ureteroscopy versus ESWL with the patient for definitive stone management in 2 weeks. Risks and benefits of the procedure were discussed with the patient including but not limited to bleeding, stent discomfort, infection, damage to surrounding structures, pain, need for future treatment, inability to place stent. Patient agrees and will proceed to the operating room this afternoon with Dr. Benancio Deeds

## 2020-02-21 NOTE — Anesthesia Preprocedure Evaluation (Signed)
Anesthesia Evaluation  Patient identified by MRN, date of birth, ID band Patient awake    Reviewed: Allergy & Precautions, NPO status , Patient's Chart, lab work & pertinent test results  Airway Mallampati: II  TM Distance: >3 FB Neck ROM: Full    Dental no notable dental hx.    Pulmonary neg pulmonary ROS, former smoker,    Pulmonary exam normal breath sounds clear to auscultation       Cardiovascular negative cardio ROS Normal cardiovascular exam Rhythm:Regular Rate:Normal     Neuro/Psych negative neurological ROS  negative psych ROS   GI/Hepatic negative GI ROS, Neg liver ROS,   Endo/Other  negative endocrine ROS  Renal/GU negative Renal ROS  negative genitourinary   Musculoskeletal negative musculoskeletal ROS (+)   Abdominal   Peds negative pediatric ROS (+)  Hematology negative hematology ROS (+)   Anesthesia Other Findings   Reproductive/Obstetrics negative OB ROS                             Anesthesia Physical Anesthesia Plan  ASA: I  Anesthesia Plan: General   Post-op Pain Management:    Induction: Intravenous  PONV Risk Score and Plan: 2 and Ondansetron, Dexamethasone and Treatment may vary due to age or medical condition  Airway Management Planned: LMA  Additional Equipment:   Intra-op Plan:   Post-operative Plan: Extubation in OR  Informed Consent: I have reviewed the patients History and Physical, chart, labs and discussed the procedure including the risks, benefits and alternatives for the proposed anesthesia with the patient or authorized representative who has indicated his/her understanding and acceptance.     Dental advisory given  Plan Discussed with: CRNA and Surgeon  Anesthesia Plan Comments:         Anesthesia Quick Evaluation

## 2020-02-21 NOTE — Anesthesia Postprocedure Evaluation (Signed)
Anesthesia Post Note  Patient: Danny Mendez  Procedure(s) Performed: CYSTOSCOPY WITH RETROGRADE PYELOGRAM AND STENT PLACEMENT (Left )     Patient location during evaluation: PACU Anesthesia Type: General Level of consciousness: awake and alert Pain management: pain level controlled Vital Signs Assessment: post-procedure vital signs reviewed and stable Respiratory status: spontaneous breathing, nonlabored ventilation, respiratory function stable and patient connected to nasal cannula oxygen Cardiovascular status: blood pressure returned to baseline and stable Postop Assessment: no apparent nausea or vomiting Anesthetic complications: no   No complications documented.  Last Vitals:  Vitals:   02/21/20 1657 02/21/20 1700  BP: 134/81 130/79  Pulse: 80 81  Resp: 12 12  Temp: 36.8 C   SpO2: 100% 100%    Last Pain:  Vitals:   02/21/20 1334  TempSrc: Oral  PainSc: 6                  Cresencia Asmus S

## 2020-02-21 NOTE — Interval H&P Note (Signed)
History and Physical Interval Note:  02/21/2020 12:59 PM  Danny Mendez  has presented today for surgery, with the diagnosis of Left Ureteral Calculi.  The various methods of treatment have been discussed with the patient and family. After consideration of risks, benefits and other options for treatment, the patient has consented to  Procedure(s): CYSTOSCOPY WITH RETROGRADE PYELOGRAM AND STENT PLACEMENT (Left) as a surgical intervention.  The patient's history has been reviewed, patient examined, no change in status, stable for surgery.  I have reviewed the patient's chart and labs.  Questions were answered to the patient's satisfaction.     Belva Agee

## 2020-02-22 ENCOUNTER — Telehealth: Payer: Self-pay | Admitting: Podiatry

## 2020-02-22 ENCOUNTER — Encounter (HOSPITAL_COMMUNITY): Payer: Self-pay | Admitting: Urology

## 2020-02-22 DIAGNOSIS — N201 Calculus of ureter: Secondary | ICD-10-CM | POA: Diagnosis not present

## 2020-02-22 LAB — CBC WITH DIFFERENTIAL/PLATELET
Abs Immature Granulocytes: 0.07 10*3/uL (ref 0.00–0.07)
Basophils Absolute: 0 10*3/uL (ref 0.0–0.1)
Basophils Relative: 0 %
Eosinophils Absolute: 0 10*3/uL (ref 0.0–0.5)
Eosinophils Relative: 0 %
HCT: 43 % (ref 39.0–52.0)
Hemoglobin: 14.6 g/dL (ref 13.0–17.0)
Immature Granulocytes: 1 %
Lymphocytes Relative: 6 %
Lymphs Abs: 0.7 10*3/uL (ref 0.7–4.0)
MCH: 31.3 pg (ref 26.0–34.0)
MCHC: 34 g/dL (ref 30.0–36.0)
MCV: 92.3 fL (ref 80.0–100.0)
Monocytes Absolute: 0.4 10*3/uL (ref 0.1–1.0)
Monocytes Relative: 3 %
Neutro Abs: 10.3 10*3/uL — ABNORMAL HIGH (ref 1.7–7.7)
Neutrophils Relative %: 90 %
Platelets: 175 10*3/uL (ref 150–400)
RBC: 4.66 MIL/uL (ref 4.22–5.81)
RDW: 12 % (ref 11.5–15.5)
WBC: 11.4 10*3/uL — ABNORMAL HIGH (ref 4.0–10.5)
nRBC: 0 % (ref 0.0–0.2)

## 2020-02-22 LAB — HIV ANTIBODY (ROUTINE TESTING W REFLEX): HIV Screen 4th Generation wRfx: NONREACTIVE

## 2020-02-22 LAB — BASIC METABOLIC PANEL
Anion gap: 9 (ref 5–15)
BUN: 17 mg/dL (ref 6–20)
CO2: 25 mmol/L (ref 22–32)
Calcium: 9.6 mg/dL (ref 8.9–10.3)
Chloride: 102 mmol/L (ref 98–111)
Creatinine, Ser: 1.01 mg/dL (ref 0.61–1.24)
GFR, Estimated: >60 mL/min (ref >60)
Glucose, Bld: 170 mg/dL — ABNORMAL HIGH (ref 70–99)
Potassium: 4.5 mmol/L (ref 3.5–5.1)
Sodium: 136 mmol/L (ref 135–145)

## 2020-02-22 MED ORDER — LEVOFLOXACIN 500 MG PO TABS: 500.0000 mg | ORAL_TABLET | Freq: Every day | ORAL | 0 refills | Status: AC

## 2020-02-22 MED ORDER — TAMSULOSIN HCL 0.4 MG PO CAPS: 0.4000 mg | ORAL_CAPSULE | Freq: Every day | ORAL | 0 refills | Status: DC

## 2020-02-22 NOTE — Progress Notes (Signed)
AVS given to patient and explained at the bedside. Medications and follow up appointments have been explained with pt verbalizing understanding.  

## 2020-02-22 NOTE — Final Progress Note (Signed)
Physician Final Progress Note  Patient ID: Danny Mendez MRN: [id number] DOB/AGE: 12-04-1965 54 y.o.  Admit date: 02/21/2020 Admitting provider: Belva Agee, MD Discharge date: 02/22/2020   Admission Diagnoses: Left ureteral calculus  Discharge Diagnoses:  Active Problems:   Left ureteral calculus Left ureteral calculus  Consults: None  Significant Findings/ Diagnostic Studies: None  Procedures: Cystoscopy, left retrograde pyelogram with insertion of left JJ stent on 02/21/2020  Discharge Condition: good  Disposition: Discharge disposition: 01-Home or Self Care     Return in 1 week to see Dr. Arita Miss  Diet: Regular diet  Discharge Activity: Activity as tolerated  Discharge Instructions    Discharge instructions   Complete by: As directed    Discharge home after breakfast     Allergies as of 02/22/2020      Reactions   Niacin And Related Other (See Comments)   Pt. Stated," makes me pass out."      Medication List    TAKE these medications   levofloxacin 500 MG tablet Commonly known as: Levaquin Take 1 tablet (500 mg total) by mouth daily for 10 days.   meloxicam 15 MG tablet Commonly known as: MOBIC Take 1 tablet (15 mg total) by mouth daily.   tamsulosin 0.4 MG Caps capsule Commonly known as: FLOMAX Take 1 capsule (0.4 mg total) by mouth daily.       Follow-up Information    Noel Christmas, MD Follow up in 1 week(s).   Specialty: Urology Contact information: [address] [place] [address] [place] [address] [phone]               Total time spent taking care of this patient: 30 minutes  Signed: Belva Agee 02/22/2020, 7:58 AM

## 2020-02-22 NOTE — Telephone Encounter (Signed)
DOS: 03/07/2020  Procedures: Peroneal Tendon Repair Lt [phone]), Tenolysis Lt [phone]), Gastrocnemius Recess Lt [phone]), Calcaneal Ostectomy (Posterior) Lt ([phone]), & Cast Application Lt   [employer] UMR Effective From: 03/22/2016 - 03/21/2020  Deductible: $400 with $400 met and $0 remaining. Out of Pocket: $3,500 with $782.21 met and $2,717.79 remaining. CoInsurance: 20% Copay:  Per Carl Best no Referral is required but Prior Authorization is required. Call Reference # [id number]  Prior Authorization # [id number] Valid from 02/22/20 - 03/04/20.

## 2020-02-22 NOTE — Discharge Summary (Signed)
Physician Discharge Summary  Patient ID: Danny Mendez MRN: [id number] DOB/AGE: 06/12/1965 54 y.o.  Admit date: 02/21/2020 Discharge date: 02/22/2020  Admission Diagnoses: Left ureteral calculus  Discharge Diagnoses: Left ureteral calculus Active Problems:   Left ureteral calculus   Discharged Condition: good  Hospital Course: Patient was admitted after undergoing cystoscopy and insertion of left JJ stent.  He was kept on IV Rocephin and was afebrile on the first postoperative night.  Patient feeling well first postoperative day and tolerating oral food.  Felt ready for discharge home.  To be discharged home on Levaquin 500 mg p.o. daily as well as tamsulosin 0.4 mg daily.  Scheduled to follow-up with Dr. Arita Miss as outpatient in 1 week to discuss further management of left ureteral calculus  Consults: None  Significant Diagnostic Studies: labs:   Treatments: surgery: Cystoscopy, insertion left JJ stent  Discharge Exam: Blood pressure 137/86, pulse 67, temperature 98.3 F (36.8 C), temperature source Oral, resp. rate 18, height 5\' 9"  (1.753 m), weight 85.3 kg, SpO2 95 %. General appearance: alert and cooperative  Disposition: Return in 1 week as outpatient.  Discharge Instructions    Discharge instructions   Complete by: As directed    Discharge home after breakfast     Allergies as of 02/22/2020      Reactions   Niacin And Related Other (See Comments)   Pt. Stated," makes me pass out."      Medication List    TAKE these medications   levofloxacin 500 MG tablet Commonly known as: Levaquin Take 1 tablet (500 mg total) by mouth daily for 10 days.   meloxicam 15 MG tablet Commonly known as: MOBIC Take 1 tablet (15 mg total) by mouth daily.   tamsulosin 0.4 MG Caps capsule Commonly known as: FLOMAX Take 1 capsule (0.4 mg total) by mouth daily.        Signed: 14/05/2019 02/22/2020, 7:52 AM

## 2020-03-06 ENCOUNTER — Other Ambulatory Visit: Payer: Self-pay | Admitting: Podiatry

## 2020-03-06 MED ORDER — ONDANSETRON HCL 4 MG PO TABS: 4.0000 mg | ORAL_TABLET | Freq: Three times a day (TID) | ORAL | 0 refills | Status: DC | PRN

## 2020-03-06 MED ORDER — OXYCODONE-ACETAMINOPHEN 10-325 MG PO TABS
1.0000 | ORAL_TABLET | Freq: Three times a day (TID) | ORAL | 0 refills | Status: AC | PRN
Start: 2020-03-06 — End: 2020-03-13

## 2020-03-06 MED ORDER — CEPHALEXIN 500 MG PO CAPS: 500.0000 mg | ORAL_CAPSULE | Freq: Three times a day (TID) | ORAL | 0 refills | Status: DC

## 2020-03-07 DIAGNOSIS — M216X2 Other acquired deformities of left foot: Secondary | ICD-10-CM | POA: Diagnosis not present

## 2020-03-07 DIAGNOSIS — M7732 Calcaneal spur, left foot: Secondary | ICD-10-CM | POA: Diagnosis not present

## 2020-03-07 DIAGNOSIS — Z978 Presence of other specified devices: Secondary | ICD-10-CM

## 2020-03-07 DIAGNOSIS — S86312A Strain of muscle(s) and tendon(s) of peroneal muscle group at lower leg level, left leg, initial encounter: Secondary | ICD-10-CM

## 2020-03-07 DIAGNOSIS — S86012D Strain of left Achilles tendon, subsequent encounter: Secondary | ICD-10-CM | POA: Diagnosis not present

## 2020-03-07 HISTORY — DX: Presence of other specified devices: Z97.8

## 2020-03-07 HISTORY — PX: FOOT SURGERY: SHX648

## 2020-03-13 ENCOUNTER — Other Ambulatory Visit: Payer: Self-pay

## 2020-03-13 ENCOUNTER — Ambulatory Visit (INDEPENDENT_AMBULATORY_CARE_PROVIDER_SITE_OTHER): Payer: Commercial Managed Care - PPO | Admitting: Podiatry

## 2020-03-13 ENCOUNTER — Encounter: Payer: Self-pay | Admitting: Podiatry

## 2020-03-13 ENCOUNTER — Ambulatory Visit (INDEPENDENT_AMBULATORY_CARE_PROVIDER_SITE_OTHER): Payer: Commercial Managed Care - PPO

## 2020-03-13 VITALS — BP 134/91 | HR 91 | Temp 97.7°F

## 2020-03-13 DIAGNOSIS — S86012D Strain of left Achilles tendon, subsequent encounter: Secondary | ICD-10-CM

## 2020-03-13 DIAGNOSIS — M7732 Calcaneal spur, left foot: Secondary | ICD-10-CM

## 2020-03-13 DIAGNOSIS — S86312D Strain of muscle(s) and tendon(s) of peroneal muscle group at lower leg level, left leg, subsequent encounter: Secondary | ICD-10-CM

## 2020-03-13 DIAGNOSIS — Z9889 Other specified postprocedural states: Secondary | ICD-10-CM

## 2020-03-13 NOTE — Progress Notes (Signed)
He presents today for postop visit date of surgery was 03/07/2020 is got a gastroc recession Achilles tenolysis Achilles repair with a retrocalcaneal heel spur resection and repair of peroneal tendon tear with a cast.  He denies fever chills nausea vomiting muscle aches pains calf pain back pain chest pain shortness of breath.  He does state that when the block wore off it was considerably painful.  Objective: Cast is intact vital signs are stable he is alert and oriented x3.  Has good circulation to his toes just loose around the proximal margin of the cast.  He has good range of motion of his toes.  Assessment: 1 week postop surgical repair left Achilles and peroneal tendon left.  Plan: Encourage range of motion exercises ice therapy and will follow up with him in 1 week for cast removal and reapplication.

## 2020-03-17 ENCOUNTER — Other Ambulatory Visit: Payer: Self-pay | Admitting: Urology

## 2020-03-20 ENCOUNTER — Encounter: Payer: Self-pay | Admitting: Podiatry

## 2020-03-20 ENCOUNTER — Other Ambulatory Visit: Payer: Self-pay

## 2020-03-20 ENCOUNTER — Ambulatory Visit (INDEPENDENT_AMBULATORY_CARE_PROVIDER_SITE_OTHER): Payer: Commercial Managed Care - PPO | Admitting: Podiatry

## 2020-03-20 ENCOUNTER — Encounter (HOSPITAL_BASED_OUTPATIENT_CLINIC_OR_DEPARTMENT_OTHER): Payer: Self-pay | Admitting: Urology

## 2020-03-20 DIAGNOSIS — M7732 Calcaneal spur, left foot: Secondary | ICD-10-CM

## 2020-03-20 DIAGNOSIS — S86012D Strain of left Achilles tendon, subsequent encounter: Secondary | ICD-10-CM | POA: Diagnosis not present

## 2020-03-20 DIAGNOSIS — S86312D Strain of muscle(s) and tendon(s) of peroneal muscle group at lower leg level, left leg, subsequent encounter: Secondary | ICD-10-CM

## 2020-03-20 DIAGNOSIS — Z9889 Other specified postprocedural states: Secondary | ICD-10-CM

## 2020-03-20 NOTE — Progress Notes (Signed)
Spoke w/ via phone for pre-op interview---pt Lab needs dos----  none             Lab results------none COVID test ------03-24-2020 at 1025 Arrive at -------830 am 03-25-2020 NPO after MN NO Solid Food.  Clear liquids from MN until---730 am then npo Medications to take morning of surgery -----none Diabetic medication -----n/a Patient Special Instructions -----none Pre-Op special Istructions -----none Patient verbalized understanding of instructions that were given at this phone interview. Patient denies shortness of breath, chest pain, fever, cough at this phone interview.

## 2020-03-20 NOTE — Progress Notes (Signed)
He presents today for his 2-week follow-up he is status post gastroc recession Achilles tenolysis heel spur resection peroneal tendon repair and cast application.  He still having a lot of problems with his kidney stones which she will be having removed on 25 March 2020.  He denies any pain in his foot.  He continues to utilize his knee scooter and crutches.  Objective: Vital signs are stable he is alert and oriented x3 cast was intact dry and clean once removed demonstrates dressed a compressive dressing and then once that was removed demonstrates staples are intact margins are well coapted peroneal tendon repair gastroc recession and Achilles repair.  No signs of infection minimal edema no erythema cellulitis drainage or odor mild ecchymosis around the heel.  Assessment: Well-healing surgical foot.  Plan: Redressed today dressed compressive dressing placed him in a new cast.  I will follow-up with him in 2 weeks for that cast removal and be placed in a cam walker.

## 2020-03-24 ENCOUNTER — Other Ambulatory Visit (HOSPITAL_COMMUNITY)
Admission: RE | Admit: 2020-03-24 | Discharge: 2020-03-24 | Disposition: A | Discharge status: Home / Self Care | Payer: Commercial Managed Care - PPO | Source: Ambulatory Visit | Attending: Urology | Admitting: Urology

## 2020-03-24 DIAGNOSIS — Z01812 Encounter for preprocedural laboratory examination: Secondary | ICD-10-CM | POA: Insufficient documentation

## 2020-03-24 DIAGNOSIS — Z20822 Contact with and (suspected) exposure to covid-19: Secondary | ICD-10-CM | POA: Diagnosis not present

## 2020-03-25 ENCOUNTER — Encounter (HOSPITAL_BASED_OUTPATIENT_CLINIC_OR_DEPARTMENT_OTHER)
Admission: RE | Disposition: A | Discharge status: Home / Self Care | Payer: Self-pay | Source: Home / Self Care | Attending: Urology

## 2020-03-25 ENCOUNTER — Ambulatory Visit (HOSPITAL_BASED_OUTPATIENT_CLINIC_OR_DEPARTMENT_OTHER): Payer: Commercial Managed Care - PPO | Admitting: Anesthesiology

## 2020-03-25 ENCOUNTER — Ambulatory Visit (HOSPITAL_BASED_OUTPATIENT_CLINIC_OR_DEPARTMENT_OTHER)
Admission: RE | Admit: 2020-03-25 | Discharge: 2020-03-25 | Disposition: A | Discharge status: Home / Self Care | Payer: Commercial Managed Care - PPO | Attending: Urology | Admitting: Urology

## 2020-03-25 ENCOUNTER — Encounter (HOSPITAL_BASED_OUTPATIENT_CLINIC_OR_DEPARTMENT_OTHER): Payer: Self-pay | Admitting: Urology

## 2020-03-25 ENCOUNTER — Other Ambulatory Visit: Payer: Self-pay

## 2020-03-25 DIAGNOSIS — N202 Calculus of kidney with calculus of ureter: Secondary | ICD-10-CM | POA: Insufficient documentation

## 2020-03-25 HISTORY — PX: CYSTOSCOPY/URETEROSCOPY/HOLMIUM LASER/STENT PLACEMENT: SHX6546

## 2020-03-25 HISTORY — DX: Presence of spectacles and contact lenses: Z97.3

## 2020-03-25 LAB — SARS CORONAVIRUS 2 (TAT 6-24 HRS): SARS Coronavirus 2: NEGATIVE

## 2020-03-25 SURGERY — CYSTOSCOPY/URETEROSCOPY/HOLMIUM LASER/STENT PLACEMENT
Anesthesia: General | Laterality: Left

## 2020-03-25 MED ORDER — SODIUM CHLORIDE 0.9 % IV SOLN
2.0000 g | INTRAVENOUS | Status: AC
  Administered 2020-03-25: 2 g via INTRAVENOUS

## 2020-03-25 MED ORDER — MIDAZOLAM HCL 2 MG/2ML IJ SOLN
INTRAMUSCULAR | Status: DC | PRN
  Administered 2020-03-25: 1 mg via INTRAVENOUS

## 2020-03-25 MED ORDER — SULFAMETHOXAZOLE-TRIMETHOPRIM 800-160 MG PO TABS: 1.0000 | ORAL_TABLET | Freq: Two times a day (BID) | ORAL | 0 refills | Status: DC

## 2020-03-25 MED ORDER — PHENAZOPYRIDINE HCL 200 MG PO TABS: 200.0000 mg | ORAL_TABLET | Freq: Three times a day (TID) | ORAL | 0 refills | Status: AC | PRN

## 2020-03-25 MED ORDER — DEXAMETHASONE SODIUM PHOSPHATE 4 MG/ML IJ SOLN
INTRAMUSCULAR | Status: DC | PRN
  Administered 2020-03-25: 8 mg via INTRAVENOUS

## 2020-03-25 MED ORDER — FENTANYL CITRATE (PF) 100 MCG/2ML IJ SOLN
INTRAMUSCULAR | Status: DC | PRN
  Administered 2020-03-25: 25 ug via INTRAVENOUS
  Administered 2020-03-25: 50 ug via INTRAVENOUS
  Administered 2020-03-25: 25 ug via INTRAVENOUS

## 2020-03-25 MED ORDER — PROMETHAZINE HCL 25 MG/ML IJ SOLN: 6.2500 mg | INTRAMUSCULAR | Status: DC | PRN

## 2020-03-25 MED ORDER — LIDOCAINE HCL (CARDIAC) PF 100 MG/5ML IV SOSY
PREFILLED_SYRINGE | INTRAVENOUS | Status: DC | PRN
  Administered 2020-03-25: 100 mg via INTRAVENOUS

## 2020-03-25 MED ORDER — MIDAZOLAM HCL 2 MG/2ML IJ SOLN
INTRAMUSCULAR | Status: AC
  Filled 2020-03-25: qty 2

## 2020-03-25 MED ORDER — SODIUM CHLORIDE 0.9 % IR SOLN
Status: DC | PRN
  Administered 2020-03-25: 6000 mL via INTRAVESICAL

## 2020-03-25 MED ORDER — SODIUM CHLORIDE 0.9 % IV SOLN: INTRAVENOUS | Status: DC

## 2020-03-25 MED ORDER — FENTANYL CITRATE (PF) 100 MCG/2ML IJ SOLN
INTRAMUSCULAR | Status: AC
  Filled 2020-03-25: qty 2

## 2020-03-25 MED ORDER — ONDANSETRON HCL 4 MG/2ML IJ SOLN
INTRAMUSCULAR | Status: DC | PRN
  Administered 2020-03-25: 4 mg via INTRAVENOUS

## 2020-03-25 MED ORDER — ACETAMINOPHEN 500 MG PO TABS
1000.0000 mg | ORAL_TABLET | Freq: Once | ORAL | Status: AC
  Administered 2020-03-25: 1000 mg via ORAL

## 2020-03-25 MED ORDER — ACETAMINOPHEN 500 MG PO TABS
ORAL_TABLET | ORAL | Status: AC
  Filled 2020-03-25: qty 2

## 2020-03-25 MED ORDER — PROPOFOL 10 MG/ML IV BOLUS
INTRAVENOUS | Status: AC
  Filled 2020-03-25: qty 20

## 2020-03-25 MED ORDER — DEXAMETHASONE SODIUM PHOSPHATE 10 MG/ML IJ SOLN
INTRAMUSCULAR | Status: AC
  Filled 2020-03-25: qty 1

## 2020-03-25 MED ORDER — FENTANYL CITRATE (PF) 100 MCG/2ML IJ SOLN
25.0000 ug | INTRAMUSCULAR | Status: DC | PRN
  Administered 2020-03-25: 50 ug via INTRAVENOUS

## 2020-03-25 MED ORDER — TAMSULOSIN HCL 0.4 MG PO CAPS: 0.4000 mg | ORAL_CAPSULE | Freq: Every day | ORAL | 0 refills | Status: DC

## 2020-03-25 MED ORDER — HYDROCODONE-ACETAMINOPHEN 5-325 MG PO TABS: 1.0000 | ORAL_TABLET | ORAL | 0 refills | Status: AC | PRN

## 2020-03-25 MED ORDER — PROPOFOL 10 MG/ML IV BOLUS
INTRAVENOUS | Status: AC
  Filled 2020-03-25: qty 60

## 2020-03-25 MED ORDER — PROPOFOL 500 MG/50ML IV EMUL
INTRAVENOUS | Status: DC | PRN
  Administered 2020-03-25: 150 ug/kg/min via INTRAVENOUS

## 2020-03-25 MED ORDER — OXYCODONE HCL 5 MG PO TABS: 5.0000 mg | ORAL_TABLET | Freq: Once | ORAL | Status: DC | PRN

## 2020-03-25 MED ORDER — SODIUM CHLORIDE 0.9 % IV SOLN
INTRAVENOUS | Status: AC
  Filled 2020-03-25: qty 100

## 2020-03-25 MED ORDER — PROPOFOL 10 MG/ML IV BOLUS
INTRAVENOUS | Status: DC | PRN
  Administered 2020-03-25: 70 mg via INTRAVENOUS
  Administered 2020-03-25: 150 mg via INTRAVENOUS
  Administered 2020-03-25: 30 mg via INTRAVENOUS
  Administered 2020-03-25: 20 mg via INTRAVENOUS

## 2020-03-25 MED ORDER — ONDANSETRON HCL 4 MG/2ML IJ SOLN
INTRAMUSCULAR | Status: AC
  Filled 2020-03-25: qty 2

## 2020-03-25 MED ORDER — OXYCODONE HCL 5 MG/5ML PO SOLN: 5.0000 mg | Freq: Once | ORAL | Status: DC | PRN

## 2020-03-25 MED ORDER — CEFTRIAXONE SODIUM 2 G IJ SOLR
INTRAMUSCULAR | Status: AC
  Filled 2020-03-25: qty 20

## 2020-03-25 SURGICAL SUPPLY — 20 items
BAG DRAIN URO-CYSTO SKYTR STRL (DRAIN) ×2 IMPLANT
BAG DRN UROCATH (DRAIN) ×1
BASKET ZERO TIP NITINOL 2.4FR (BASKET) ×2 IMPLANT
BSKT STON RTRVL ZERO TP 2.4FR (BASKET) ×1
CATH URET 5FR 28IN OPEN ENDED (CATHETERS) ×2 IMPLANT
CLOTH BEACON ORANGE TIMEOUT ST (SAFETY) ×2 IMPLANT
DRSG TEGADERM 4X4.75 (GAUZE/BANDAGES/DRESSINGS) ×2 IMPLANT
EXTRACTOR STONE 1.7FRX115CM (UROLOGICAL SUPPLIES) IMPLANT
GLOVE BIO SURGEON STRL SZ 6.5 (GLOVE) ×2 IMPLANT
GOWN STRL REUS W/TWL LRG LVL3 (GOWN DISPOSABLE) ×2 IMPLANT
GUIDEWIRE STR DUAL SENSOR (WIRE) ×2 IMPLANT
IV NS IRRIG 3000ML ARTHROMATIC (IV SOLUTION) ×2 IMPLANT
KIT TURNOVER CYSTO (KITS) ×2 IMPLANT
MANIFOLD NEPTUNE II (INSTRUMENTS) ×2 IMPLANT
PACK CYSTO (CUSTOM PROCEDURE TRAY) ×2 IMPLANT
SHEATH URET ACCESS 12FR/35CM (UROLOGICAL SUPPLIES) ×2 IMPLANT
TRACTIP FLEXIVA PULS ID 200XHI (Laser) ×1 IMPLANT
TRACTIP FLEXIVA PULSE ID 200 (Laser) ×2
TUBE CONNECTING 12X1/4 (SUCTIONS) ×2 IMPLANT
TUBING UROLOGY SET (TUBING) ×2 IMPLANT

## 2020-03-25 NOTE — Discharge Instructions (Signed)
DISCHARGE INSTRUCTIONS FOR KIDNEY STONE/URETERAL STENT   MEDICATIONS:  1.  Resume all your other meds from home  2. Pyridium is to help with the burning/stinging when you urinate. 3. Hydrocodone-acetaminophen is for moderate/severe pain, otherwise taking upto 1000 mg every 6 hours of plain acetaminophen (Tylenol) will help treat your pain.   4. Take Bactrim antibiotic as prescribed    PLEASE BRING YOUR STONE FRAGMENTS TO THE OFFICE AT YOUR FOLLOW UP SO THEY CAN BE ANALYZED   ACTIVITY:  1. No strenuous activity x 1week  2. No driving while on narcotic pain medications  3. Drink plenty of water  4. Continue to walk at home - you can still get blood clots when you are at home, so keep active, but don't over do it.  5. May return to work/school tomorrow or when you feel ready   BATHING:  1. You can shower and we recommend daily showers  2. You have a string coming from your urethra: The stent string is attached to your ureteral stent. Do not pull on this.   SIGNS/SYMPTOMS TO CALL:  Please call us if you have a fever greater than 101.5, uncontrolled nausea/vomiting, uncontrolled pain, dizziness, unable to urinate, bloody urine, chest pain, shortness of breath, leg swelling, leg pain, redness around wound, drainage from wound, or any other concerns or questions.   You can reach [place] at [phone].   FOLLOW-UP:  1. You have an appointment in 6 weeks with a ultrasound of your kidneys prior.   2. You have a string attached to your stent.  It will be removed in the office in 3 days - please take your antibiotic prior to your appointment.   Post Anesthesia Home Care Instructions  Activity: Get plenty of rest for the remainder of the day. A responsible individual must stay with you for 24 hours following the procedure.  For the next 24 hours, DO NOT: -Drive a car -Advertising copywriter -Drink alcoholic beverages -Take any medication unless instructed by your physician -Make any legal  decisions or sign important papers.  Meals: Start with liquid foods such as gelatin or soup. Progress to regular foods as tolerated. Avoid greasy, spicy, heavy foods. If nausea and/or vomiting occur, drink only clear liquids until the nausea and/or vomiting subsides. Call your physician if vomiting continues.  Special Instructions/Symptoms: Your throat may feel dry or sore from the anesthesia or the breathing tube placed in your throat during surgery. If this causes discomfort, gargle with warm salt water. The discomfort should disappear within 24 hours.

## 2020-03-25 NOTE — Anesthesia Postprocedure Evaluation (Signed)
Anesthesia Post Note  Patient: Danny Mendez  Procedure(s) Performed: CYSTOSCOPY LEFT URETEROSCOPY/HOLMIUM LASER/STENT EXCHANGE STONE BASKET EXTRACTION  (Left )     Patient location during evaluation: PACU Anesthesia Type: General Level of consciousness: awake and alert and oriented Pain management: pain level controlled Vital Signs Assessment: post-procedure vital signs reviewed and stable Respiratory status: spontaneous breathing, nonlabored ventilation and respiratory function stable Cardiovascular status: blood pressure returned to baseline Postop Assessment: no apparent nausea or vomiting Anesthetic complications: no   No complications documented.  Last Vitals:  Vitals:   03/25/20 1300 03/25/20 1305  BP: (!) 161/87 (!) 147/87  Pulse: (!) 57   Resp: 15   Temp: (!) 36.3 C   SpO2: 98%     Last Pain:  Vitals:   03/25/20 1215  TempSrc:   PainSc: 0-No pain                 Kaylyn Layer

## 2020-03-25 NOTE — H&P (Signed)
CC/HPI: cc: Urolithiasis   02/21/20: 55 year old man who developed acute onset flank pain found to have an 8 mm left UPJ calculus on imaging as well as to whether nonobstructing left renal calculi. Patient is febrile in the office today to 102.2. His urine does not look consistent with infection however he feels tired and has flank pain requiring oxycodone. He last had stones in 07-19-07 which he passed on his own. He has never had surgery for kidney stones before. He is scheduled for foot surgery on 03/07/2020.   02/29/2020: 55 year old man with an 8 mm left UPJ calculus as well as 9 mm nonobstructing left renal calculus who underwent urgent stent placement on 02/21/2020 for fever returns for follow-up. He is scheduled for for surgery on 03/07/2020. He has not been tolerating the stent well. He also reports intermittent gross hematuria. He has been taking Flomax but is at pain medication. No fevers, chills, nausea or vomiting.     ALLERGIES:  Niacin    MEDICATIONS: No Reported Medications     GU PSH: Cystoscopy Insert Stent, Left - 02/21/2020       PSH Notes: Encounter for contraceptive planning, No Surgical Problems   NON-GU PSH: None   GU PMH: Renal calculus - 02/21/2020, Kidney stone on left side, - 2012-07-18 Ureteral calculus - 02/21/2020, Distal Ureteral Stone On The Left, - 2014 History of urolithiasis, Nephrolithiasis - 07/18/2012      PMH Notes:  1898-03-22 00:00:00 - Note: Normal Routine History And Physical Adult   NON-GU PMH: None   FAMILY HISTORY: Death In The Family Father - Father Family Health Status Number - Father nephrolithiasis - Father, Mother   SOCIAL HISTORY: None    Notes: Tobacco Use, Marital History - Currently Married, Occupation:, Caffeine Use, Being A Social Drinker   REVIEW OF SYSTEMS:    GU Review Male:   Patient denies frequent urination, hard to postpone urination, burning/ pain with urination, get up at night to urinate, leakage of urine, stream starts and  stops, trouble starting your stream, have to strain to urinate , erection problems, and penile pain.  Gastrointestinal (Upper):   Patient denies nausea, vomiting, and indigestion/ heartburn.  Gastrointestinal (Lower):   Patient denies diarrhea and constipation.  Constitutional:   Patient denies fever, night sweats, weight loss, and fatigue.  Skin:   Patient denies skin rash/ lesion and itching.  Eyes:   Patient denies blurred vision and double vision.  Ears/ Nose/ Throat:   Patient denies sore throat and sinus problems.  Hematologic/Lymphatic:   Patient denies swollen glands and easy bruising.  Cardiovascular:   Patient denies leg swelling and chest pains.  Respiratory:   Patient denies shortness of breath and cough.  Endocrine:   Patient denies excessive thirst.  Musculoskeletal:   Patient denies back pain and joint pain.  Neurological:   Patient denies headaches and dizziness.  Psychologic:   Patient denies depression and anxiety.   VITAL SIGNS:      02/29/2020 08:43 AM  Weight 184 lb / 83.46 kg  Height 69 in / 175.26 cm  BP 133/87 mmHg  Pulse 82 /min  Temperature 97.8 F / 36.5 C  BMI 27.2 kg/m   MULTI-SYSTEM PHYSICAL EXAMINATION:    Constitutional: Well-nourished. No physical deformities. Normally developed. Good grooming.  Neck: Neck symmetrical, not swollen. Normal tracheal position.  Respiratory: No labored breathing, no use of accessory muscles.   Cardiovascular: Normal temperature  Skin: No paleness, no jaundice, no cyanosis. No lesion, no ulcer, no  rash.  Neurologic / Psychiatric: Oriented to time, oriented to place, oriented to person. No depression, no anxiety, no agitation.  Gastrointestinal: No rigidity, non obese abdomen.   Eyes: Normal conjunctivae. Normal eyelids.  Ears, Nose, Mouth, and Throat: Left ear no scars, no lesions, no masses. Right ear no scars, no lesions, no masses. Nose no scars, no lesions, no masses. Normal hearing. Normal lips.  Musculoskeletal:  Normal gait and station of head and neck.     Complexity of Data:  Records Review:   POC Tool  Urine Test Review:   Urinalysis  X-Ray Review: C.T. Abdomen/Pelvis: Reviewed Films. Discussed With Patient.     PROCEDURES:          Urinalysis w/Scope Dipstick Dipstick Cont'd Micro  Color: Red Bilirubin: Neg mg/dL WBC/hpf: 0 - 5/hpf  Appearance: Cloudy Ketones: Neg mg/dL RBC/hpf: 40 - 59/DJT  Specific Gravity: 1.020 Blood: 3+ ery/uL Bacteria: Few (10-25/hpf)  pH: 6.0 Protein: 3+ mg/dL Cystals: NS (Not Seen)  Glucose: Neg mg/dL Urobilinogen: 0.2 mg/dL Casts: NS (Not Seen)    Nitrites: Neg Trichomonas: Not Present    Leukocyte Esterase: 2+ leu/uL Mucous: Not Present      Epithelial Cells: NS (Not Seen)      Yeast: NS (Not Seen)      Sperm: Not Present    Notes: Unspun micro due to clarity    ASSESSMENT:      ICD-10 Details  1 GU:   Renal calculus - N20.0 Chronic, Stable - Discussed patient's CT scan which show an 8 mm left UPJ calculus with a 9 mm nonobstructing left renal calculus as well as a 3 mm right nonobstructing renal calculus. We discussed management options for stones including ESWL versus ureteroscopy with laser lithotripsy. Patient would like to have 1 procedure and keeping this in mind we decided to proceed with left ureteroscopy with laser lithotripsy and stent exchange. Due to his foot surgery will postpone this procedure approximately 4 weeks for now. I will refill his pain medication and given oxybutynin to see if this helps with bladder urgency and suprapubic pressure. We discussed the risks and benefits of procedure including but not limited to bleeding, pain, infection, stent pain, damage to surrounding structures, need for future treatment. Patient was encouraged to drink 2-3 L of water a day.  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

## 2020-03-25 NOTE — Transfer of Care (Signed)
Immediate Anesthesia Transfer of Care Note  Patient: Danny Mendez  Procedure(s) Performed: CYSTOSCOPY LEFT URETEROSCOPY/HOLMIUM LASER/STENT EXCHANGE STONE BASKET EXTRACTION  (Left )  Patient Location: PACU  Anesthesia Type:General  Level of Consciousness: awake, alert , oriented and patient cooperative  Airway & Oxygen Therapy: Patient Spontanous Breathing with face mask O2  Post-op Assessment: Report given to RN, Post -op Vital signs reviewed and stable and Patient moving all extremities X 4  Post vital signs: Reviewed and stable  Last Vitals:  Vitals Value Taken Time  BP 151/87 03/25/20 1149  Temp    Pulse 68 03/25/20 1153  Resp 17 03/25/20 1153  SpO2 99 % 03/25/20 1153  Vitals shown include unvalidated device data.  Last Pain:  Vitals:   03/25/20 0921  TempSrc: Oral  PainSc: 1       Patients Stated Pain Goal: 2 (03/25/20 6244)  Complications: No complications documented.

## 2020-03-25 NOTE — Anesthesia Preprocedure Evaluation (Addendum)
Anesthesia Evaluation  Patient identified by MRN, date of birth, ID band Patient awake    Reviewed: Allergy & Precautions, NPO status , Patient's Chart, lab work & pertinent test results  History of Anesthesia Complications Negative for: history of anesthetic complications  Airway Mallampati: I  TM Distance: >3 FB Neck ROM: Full    Dental no notable dental hx.    Pulmonary former smoker,    Pulmonary exam normal        Cardiovascular negative cardio ROS Normal cardiovascular exam     Neuro/Psych negative neurological ROS  negative psych ROS   GI/Hepatic negative GI ROS, Neg liver ROS,   Endo/Other  negative endocrine ROS  Renal/GU Renal stones  negative genitourinary   Musculoskeletal negative musculoskeletal ROS (+)   Abdominal   Peds  Hematology negative hematology ROS (+)   Anesthesia Other Findings Day of surgery medications reviewed with patient.  Reproductive/Obstetrics negative OB ROS                            Anesthesia Physical Anesthesia Plan  ASA: II  Anesthesia Plan: General   Post-op Pain Management:    Induction: Intravenous  PONV Risk Score and Plan: 2 and Treatment may vary due to age or medical condition, Ondansetron, Dexamethasone and Midazolam  Airway Management Planned: LMA  Additional Equipment:   Intra-op Plan:   Post-operative Plan: Extubation in OR  Informed Consent: I have reviewed the patients History and Physical, chart, labs and discussed the procedure including the risks, benefits and alternatives for the proposed anesthesia with the patient or authorized representative who has indicated his/her understanding and acceptance.     Dental advisory given  Plan Discussed with: CRNA  Anesthesia Plan Comments:        Anesthesia Quick Evaluation

## 2020-03-25 NOTE — Anesthesia Procedure Notes (Signed)
Procedure Name: LMA Insertion Date/Time: 03/25/2020 10:41 AM Performed by: Earmon Phoenix, CRNA Pre-anesthesia Checklist: Patient identified, Emergency Drugs available, Suction available, Patient being monitored and Timeout performed LMA: LMA inserted LMA Size: 5.0 Placement Confirmation: CO2 detector,  positive ETCO2 and breath sounds checked- equal and bilateral Tube secured with: Tape Dental Injury: Teeth and Oropharynx as per pre-operative assessment  Comments: LMA replaced because of leak

## 2020-03-25 NOTE — Op Note (Signed)
Preoperative diagnosis: left ureteral and renal calculi  Postoperative diagnosis: left ureteral and renal calculi  Procedure:  1. Cystoscopy 2. left ureteroscopy, laser lithotripsy, basket stone extraction  3. left 43F x 26 ureteral stent exchange    Surgeon: Kasandra Knudsen, MD  Anesthesia: General  Complications: None  Intraoperative findings:  1.  Normal anterior urethra 2.   Bilobar prostatic hypertrophy 3.   Orthotropic UOs 4.   43Fr x 26 cmm JJ right stent with tether 5.   8 mm proximal ureteral calculus fragmented with additional renal calculi   EBL: Minimal  Specimens: 1. left ureteral calculus  Disposition of specimens: [facility] for stone analysis  Indication: Danny Mendez is a 55 y.o.   patient with a 8 mm left ureteral stone and nonobstructing left renal calculi who underwent urgent ureteral stent placement after he was found to be febrile in the office with the obstructing stone.  He now returns for definitive management of the stones on the left side. After reviewing the management options for treatment, the patient elected to proceed with the above surgical procedure(s). We have discussed the potential benefits and risks of the procedure, side effects of the proposed treatment, the likelihood of the patient achieving the goals of the procedure, and any potential problems that might occur during the procedure or recuperation. Informed consent has been obtained.   Description of procedure:  The patient was taken to the operating room and general anesthesia was induced.  The patient was placed in the dorsal lithotomy position, prepped and draped in the usual sterile fashion, and preoperative antibiotics were administered. A preoperative time-out was performed.   Cystourethroscopy was performed.  The patient's urethra was examined and demonstrated bilobar prostatic hypertrophy.The bladder was then systematically examined in its entirety. There was no  evidence for any bladder tumors, stones, or other mucosal pathology.    Attention then turned to the left ureteral orifice and graspers were used to grab the distal end of the ureteral stent and bring it to the urethral meatus.  A sensor wire was then advanced through the ureteral stent up to the kidney with fluoroscopic guidance.  Semirigid ureteroscopy then took place and encountered the stone in the proximal ureter.  Laser lithotripsy was then used to fragment the stone with a 200 m holmium laser fiber.  The larger pieces were then removed with a basket.  Next a second wire was placed through the ureteroscope and the ureteroscope was removed.  A ureteral access sheath was then placed over the second wire and advanced to the kidney for scopic guidance.  The inner sheath and wire were removed.  Flexible ureteroscopy then took place which encountered stones in the lower pole.  They were fragmented with the 200 m laser fiber.  The larger pieces were then extracted with a zero tip basket.  Further inspection of the kidney revealed no other fragments larger than 2 mm in size.  The ureteroscope was then removed and unison with the ureteral access sheath taking care to examine the ureter on the way out.  There is no injury noted to the ureter or existing stones seen.    The wire was then backloaded through the cystoscope and a ureteral stent was advance over the wire using Seldinger technique.  The stent was positioned appropriately under fluoroscopic and cystoscopic guidance.  The wire was then removed with an adequate stent curl noted in the renal pelvis as well as in the bladder.  The bladder was then  emptied and the procedure ended.  The patient appeared to tolerate the procedure well and without complications.  The patient was able to be awakened and transferred to the recovery unit in satisfactory condition.   Disposition: The tether of the stent was left on and secured to the ventral aspect of the  patient's penis. Instructions for removing the stent have been provided to the patient.

## 2020-03-26 ENCOUNTER — Encounter (HOSPITAL_BASED_OUTPATIENT_CLINIC_OR_DEPARTMENT_OTHER): Payer: Self-pay | Admitting: Urology

## 2020-04-01 ENCOUNTER — Ambulatory Visit (INDEPENDENT_AMBULATORY_CARE_PROVIDER_SITE_OTHER): Payer: Commercial Managed Care - PPO | Admitting: Podiatry

## 2020-04-01 ENCOUNTER — Encounter: Payer: Self-pay | Admitting: Podiatry

## 2020-04-01 ENCOUNTER — Other Ambulatory Visit: Payer: Self-pay

## 2020-04-01 DIAGNOSIS — S86012D Strain of left Achilles tendon, subsequent encounter: Secondary | ICD-10-CM

## 2020-04-01 DIAGNOSIS — Z9889 Other specified postprocedural states: Secondary | ICD-10-CM

## 2020-04-01 DIAGNOSIS — M7732 Calcaneal spur, left foot: Secondary | ICD-10-CM

## 2020-04-01 DIAGNOSIS — S86312D Strain of muscle(s) and tendon(s) of peroneal muscle group at lower leg level, left leg, subsequent encounter: Secondary | ICD-10-CM

## 2020-04-02 NOTE — Progress Notes (Signed)
He presents today for follow-up of his gastroc recession peroneal tendon repair Achilles tenolysis heel spur resection and cast application.  He is just getting over his kidney stone surgery and states that if the foot is been bothering him a little bit little tender along the side and he says he is being keeping it elevated is much as possible that he has had to do a lot of traveling with this kidney stone.  Objective: Vital signs are stable he is alert oriented x3 once cast was removed Staples were removed margins well coapted there is mild edema overlying the peroneal incision site significant gastroc wasting secondary to the cast.  All of the incisions appear to be well-healed and intact.  He has some tenderness on dorsiflexion and plantarflexion to the point where he does not want to voluntarily do this that was manually he has good range of motion.  Assessment: Well-healing surgical foot leg.  Plan: Placed him in a cam boot recommend that he continue nonweightbearing status for another couple of weeks and then we will start him off on partial weightbearing.  I will follow-up with him in 2 weeks.  I will allow him to start getting this wet washing and cleaning and putting some lotion on it.

## 2020-04-03 ENCOUNTER — Telehealth: Payer: Self-pay | Admitting: *Deleted

## 2020-04-03 NOTE — Telephone Encounter (Signed)
Patient is wanting suggestions on some type of lotion or cream to use on his foot to help with the thick  scars left from surgery. Please advise.

## 2020-04-04 ENCOUNTER — Telehealth: Payer: Self-pay | Admitting: *Deleted

## 2020-04-04 NOTE — Telephone Encounter (Signed)
Addressed patient message through MyChart 

## 2020-04-04 NOTE — Telephone Encounter (Signed)
completed

## 2020-04-15 ENCOUNTER — Encounter: Payer: Self-pay | Admitting: Podiatry

## 2020-04-15 ENCOUNTER — Ambulatory Visit (INDEPENDENT_AMBULATORY_CARE_PROVIDER_SITE_OTHER): Payer: Commercial Managed Care - PPO | Admitting: Podiatry

## 2020-04-15 ENCOUNTER — Other Ambulatory Visit: Payer: Self-pay

## 2020-04-15 DIAGNOSIS — S86012D Strain of left Achilles tendon, subsequent encounter: Secondary | ICD-10-CM

## 2020-04-15 DIAGNOSIS — Z9889 Other specified postprocedural states: Secondary | ICD-10-CM

## 2020-04-15 DIAGNOSIS — M7732 Calcaneal spur, left foot: Secondary | ICD-10-CM

## 2020-04-15 DIAGNOSIS — S86312D Strain of muscle(s) and tendon(s) of peroneal muscle group at lower leg level, left leg, subsequent encounter: Secondary | ICD-10-CM

## 2020-04-15 NOTE — Progress Notes (Signed)
He presents today date of surgery 03/07/2020 left foot gastroc recession peroneal Achilles tendon repairs and retrocalcaneal heel spur resection with endoscopic fasciotomy. States that he is feeling some numbness in the little toe but other than that he is doing very well. States that he continues to stand with his cam walker.  Objective: Vital signs are stable he is alert oriented x3 he has good plantar flexion against resistance. Margins of the Achilles feel good he still has some swelling to the lateral aspect of the left foot but good abduction against resistance.  Assessment: Well-healing surgical foot left.  Plan: At this point I would like him to start partial weightbearing progressing to full weightbearing follow-up with him in 2 weeks at which time we will be into a pair of tennis shoes but most likely will place him in a Tri-Lock brace.

## 2020-04-24 ENCOUNTER — Telehealth: Payer: Self-pay | Admitting: *Deleted

## 2020-04-25 NOTE — Telephone Encounter (Signed)
completed

## 2020-05-01 ENCOUNTER — Ambulatory Visit (INDEPENDENT_AMBULATORY_CARE_PROVIDER_SITE_OTHER): Payer: Commercial Managed Care - PPO | Admitting: Podiatry

## 2020-05-01 ENCOUNTER — Encounter: Payer: Self-pay | Admitting: Podiatry

## 2020-05-01 ENCOUNTER — Other Ambulatory Visit: Payer: Self-pay

## 2020-05-01 DIAGNOSIS — S86312D Strain of muscle(s) and tendon(s) of peroneal muscle group at lower leg level, left leg, subsequent encounter: Secondary | ICD-10-CM

## 2020-05-01 DIAGNOSIS — Z9889 Other specified postprocedural states: Secondary | ICD-10-CM

## 2020-05-01 DIAGNOSIS — S86012D Strain of left Achilles tendon, subsequent encounter: Secondary | ICD-10-CM

## 2020-05-01 DIAGNOSIS — M7732 Calcaneal spur, left foot: Secondary | ICD-10-CM

## 2020-05-03 NOTE — Progress Notes (Signed)
He presents today for postop visit date of surgery 03/07/2020 gastroc recession Achilles tenolysis with retrocalcaneal spur resection and peroneal tendon repair.  Dates that it is feeling better every day feeling stronger but he is still walking with his crutches he says.  Objective: Vital signs are stable he is alert and oriented x3 presents today with cam walker and crutches.  As he ambulates and he is still carrying his leg rather than walking on it.  Once his cam walker was removed I evaluated his foot he has good plantar flexion against resistance though it is probably a 4 out of 5 and muscle strength.  He does have considerable loss of gastroc after the gastroc recession which should come back once we start becoming more active on it.  His peroneal tendon repair appears to be healing very nicely though there is some swelling in that area.  Assessment: Well-healing surgical foot slower than I usually like to see with this type of procedure.  Plan: At this point I am going to highly recommend that he discontinue the use of the crutches continue the use of the cam walker until we become more stable without gait I expressed to him how to do a heel-to-toe gait with the cam walker and I would like to see this on progress quickly.  I will follow-up with him again in a couple of weeks to make sure he is off his crutches we will then start a Tri-Lock brace for peroneal stability and tennis shoe use.  At that point it may be optimal to go ahead and get him started with physical therapy as well.

## 2020-05-12 ENCOUNTER — Encounter (INDEPENDENT_AMBULATORY_CARE_PROVIDER_SITE_OTHER): Payer: Self-pay

## 2020-05-15 ENCOUNTER — Other Ambulatory Visit: Payer: Self-pay

## 2020-05-15 ENCOUNTER — Ambulatory Visit (INDEPENDENT_AMBULATORY_CARE_PROVIDER_SITE_OTHER): Payer: Commercial Managed Care - PPO | Admitting: Podiatry

## 2020-05-15 ENCOUNTER — Encounter: Payer: Self-pay | Admitting: Podiatry

## 2020-05-15 DIAGNOSIS — M7732 Calcaneal spur, left foot: Secondary | ICD-10-CM

## 2020-05-15 DIAGNOSIS — Z9889 Other specified postprocedural states: Secondary | ICD-10-CM

## 2020-05-15 DIAGNOSIS — S86012D Strain of left Achilles tendon, subsequent encounter: Secondary | ICD-10-CM | POA: Diagnosis not present

## 2020-05-15 DIAGNOSIS — S86312D Strain of muscle(s) and tendon(s) of peroneal muscle group at lower leg level, left leg, subsequent encounter: Secondary | ICD-10-CM

## 2020-05-15 NOTE — Progress Notes (Signed)
He presents today date of surgery 1217 2021 ambulating in his cam walker.  He is status post Achilles tendon repair gastroc recession retrocalcaneal spur resection peroneal tendon repair and cast application.  He states that he is doing better and getting stronger every day.  Objective: Vital signs are stable he is alert oriented x3 pulses are palpable.  Minimal edema.  Incision sites abdominal to heal uneventfully.  He has good dorsiflexion and plantarflexion and the margins of the tendons appear to be healing very nicely.  Assessment: Well-healing surgical foot date of surgery 1217 2021.  Plan: At this point I am going to recommend a Tri-Lock brace he will wear this daily with his tennis shoe unless he becomes too fatigued at which time he will go back to his cam walker.  I will follow-up with him in 2 weeks at which time we hope to allow him back into his regular tennis shoe.

## 2020-05-29 ENCOUNTER — Encounter: Payer: Commercial Managed Care - PPO | Admitting: Podiatry

## 2020-06-03 ENCOUNTER — Other Ambulatory Visit: Payer: Self-pay

## 2020-06-03 ENCOUNTER — Ambulatory Visit (INDEPENDENT_AMBULATORY_CARE_PROVIDER_SITE_OTHER): Payer: Commercial Managed Care - PPO | Admitting: Podiatry

## 2020-06-03 ENCOUNTER — Encounter: Payer: Self-pay | Admitting: Podiatry

## 2020-06-03 DIAGNOSIS — S86312D Strain of muscle(s) and tendon(s) of peroneal muscle group at lower leg level, left leg, subsequent encounter: Secondary | ICD-10-CM

## 2020-06-03 DIAGNOSIS — S86012D Strain of left Achilles tendon, subsequent encounter: Secondary | ICD-10-CM

## 2020-06-03 DIAGNOSIS — M7732 Calcaneal spur, left foot: Secondary | ICD-10-CM

## 2020-06-03 DIAGNOSIS — Z9889 Other specified postprocedural states: Secondary | ICD-10-CM

## 2020-06-03 NOTE — Progress Notes (Signed)
He presents today for follow-up of his nonsurgical foot.  Date of surgery 03/07/2020 left heel spur resection and repair of peroneal tendon.  Denies fever chills nausea vomiting states that he continues to wear his brace with his tennis shoes.  Dates that he has some pain in his heel if he standing for a long time other than that he seems to be doing better.  Objective: Vital signs are stable he is alert oriented x3.  Gastroc muscle is coming back.  Is getting bigger and stronger.  He has gone on to heal all of his scars he does have still demonstrate some neuralgia to his visceral nerve distribution from the lateral malleolus extending to the toe.  He has good plantar flexion against resistance with no pain.  Assessment: Well-healing surgical leg and foot.  Plan: I do think he would benefit from physical therapy for strengthening and balance training for the peroneal tendon as well as the Achilles tendons.  I am going to request that he continue to do that I want him to start taking some short walks to build up his endurance I will follow-up with him in 1 month.

## 2020-06-03 NOTE — Addendum Note (Signed)
Addended by: Kristian Covey on: 06/03/2020 05:12 PM   Modules accepted: Orders

## 2020-07-08 ENCOUNTER — Ambulatory Visit (INDEPENDENT_AMBULATORY_CARE_PROVIDER_SITE_OTHER): Payer: Commercial Managed Care - PPO | Admitting: Podiatry

## 2020-07-08 ENCOUNTER — Other Ambulatory Visit: Payer: Self-pay

## 2020-07-08 ENCOUNTER — Encounter: Payer: Self-pay | Admitting: Podiatry

## 2020-07-08 DIAGNOSIS — M7732 Calcaneal spur, left foot: Secondary | ICD-10-CM

## 2020-07-08 DIAGNOSIS — S86012D Strain of left Achilles tendon, subsequent encounter: Secondary | ICD-10-CM | POA: Diagnosis not present

## 2020-07-08 DIAGNOSIS — Z9889 Other specified postprocedural states: Secondary | ICD-10-CM | POA: Diagnosis not present

## 2020-07-08 DIAGNOSIS — S86312D Strain of muscle(s) and tendon(s) of peroneal muscle group at lower leg level, left leg, subsequent encounter: Secondary | ICD-10-CM | POA: Diagnosis not present

## 2020-07-09 NOTE — Progress Notes (Signed)
He presents today for postop visit date of surgery 03/07/2020 gastroc recession Achilles tenolysis retrocalcaneal heel spur resection and peroneal tendon repair.  He states that he is barely 80 to 90% improved from prior to surgery.  States that he still feels weakness and numbness.  As he points to the lateral aspect of the left foot stating that that is a numb area.  He is also stating that his calf is somewhat weak.  He did attend physical therapy for 1 day but it was expensive so he could not attend any longer.  States that he has been walking and exercising trying to increase the distance daily to strengthen the leg.  He states that he is not able to return to work as of yet he does not have the endurance necessary for that as of yet.  Objective: Vital signs are stable he is alert and oriented x3.  Pulses are palpable.  Gastroc muscle is increasing in size no tenderness on palpation of the Achilles tendon he is still 4 out of 5 plantarflexion against resistance.  He does have some mild loss of sensation to the lateral aspect of the left foot with still some edema present.  Assessment: Slowly healing postoperative course left foot.  Still retains some edema and some weakness.  Plan: I will continue him out of work for another 6 to 8 weeks I will follow-up with him in a month I did encourage him to continue to increase his endurance daily I also encouraged him to purchase compression socks even if they were boots socks.  Call with questions or concerns.

## 2020-08-12 ENCOUNTER — Ambulatory Visit: Payer: Commercial Managed Care - PPO | Admitting: Podiatry

## 2020-08-14 ENCOUNTER — Ambulatory Visit (HOSPITAL_BASED_OUTPATIENT_CLINIC_OR_DEPARTMENT_OTHER): Admit: 2020-08-14 | Payer: Commercial Managed Care - PPO | Admitting: Urology

## 2020-08-14 ENCOUNTER — Encounter (HOSPITAL_BASED_OUTPATIENT_CLINIC_OR_DEPARTMENT_OTHER): Payer: Self-pay

## 2020-08-14 SURGERY — LITHOTRIPSY, ESWL
Anesthesia: LOCAL | Laterality: Left

## 2020-08-19 ENCOUNTER — Encounter: Payer: Self-pay | Admitting: Podiatry

## 2020-08-19 ENCOUNTER — Other Ambulatory Visit: Payer: Self-pay

## 2020-08-19 ENCOUNTER — Ambulatory Visit: Payer: Commercial Managed Care - PPO | Admitting: Podiatry

## 2020-08-19 DIAGNOSIS — M7732 Calcaneal spur, left foot: Secondary | ICD-10-CM | POA: Diagnosis not present

## 2020-08-19 DIAGNOSIS — S86312D Strain of muscle(s) and tendon(s) of peroneal muscle group at lower leg level, left leg, subsequent encounter: Secondary | ICD-10-CM | POA: Diagnosis not present

## 2020-08-19 DIAGNOSIS — S86012D Strain of left Achilles tendon, subsequent encounter: Secondary | ICD-10-CM | POA: Diagnosis not present

## 2020-08-19 DIAGNOSIS — Z9889 Other specified postprocedural states: Secondary | ICD-10-CM

## 2020-08-19 NOTE — Progress Notes (Signed)
He presents today date of surgery March 07, 2020 left gastroc recession peroneal tendon repair Achilles tenolysis with heel spur resection and cast.  States that the knot comes up on the heel occasionally but goes down.  States that he is not in any pain though it has increased his activity level.  States that he is 90% improved from surgery.  Objective: Vital signs are stable alert oriented x3.  Pulses are palpable.  There is no erythema edema cellulitis drainage or odor.  Incision site is gone to heal 100%.  He has good plantar flexion against resistance.  Assessment: Well-healing surgical foot.  Plan: I will allow him to get back to work.  He will start back Monday full duty.

## 2020-09-26 ENCOUNTER — Encounter: Payer: Self-pay | Admitting: Podiatry

## 2021-11-19 IMAGING — CT CT RENAL STONE PROTOCOL
2 of 4 series · 15 of 46 positions shown, 17 images · non-contrast
Comparison: None.

CLINICAL DATA: Left flank pain and nausea this morning.

EXAM:
CT ABDOMEN AND PELVIS WITHOUT CONTRAST
TECHNIQUE: Multidetector CT imaging of the abdomen and pelvis was performed
following the standard protocol without IV contrast.

[Series 2: axial st · axial · 0.89mm/px · z∈[-521,-111]mm · 12 of 98 slices shown, 14 images]
[im 8/98  soft-tissue]
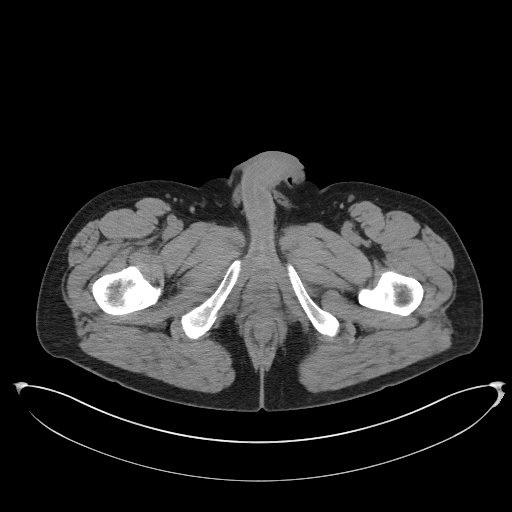
[im 8/98  bone]
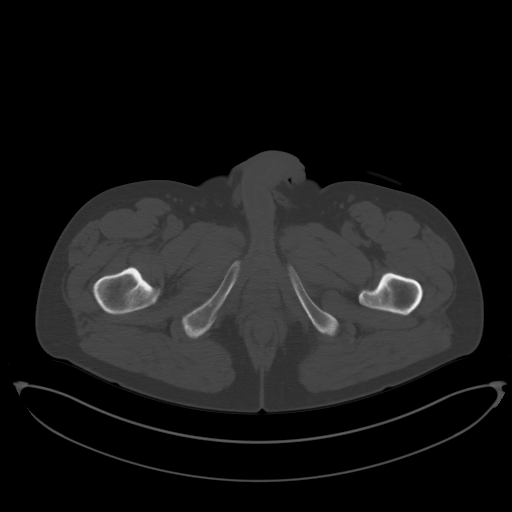
[im 15/98  soft-tissue]
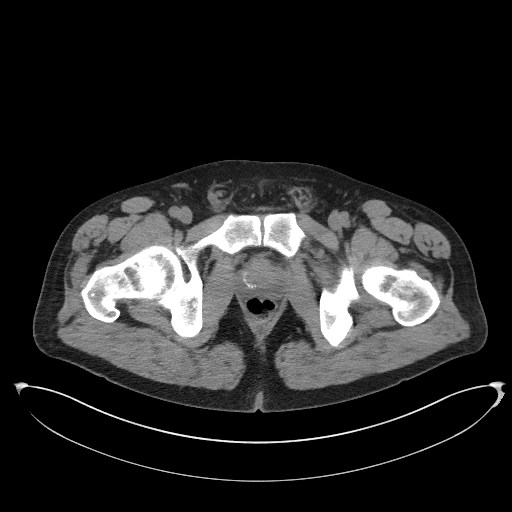
[im 23/98  soft-tissue]
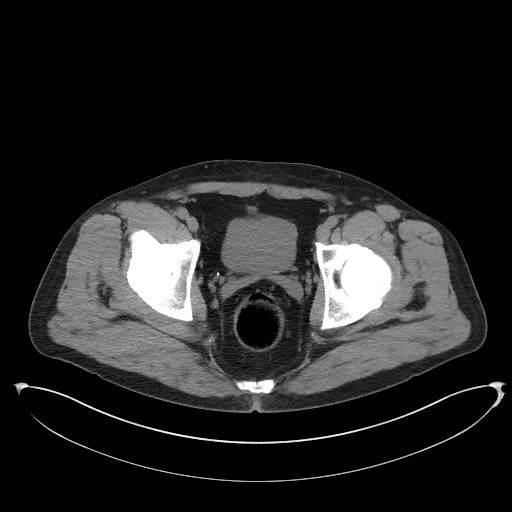
[im 30/98  soft-tissue]
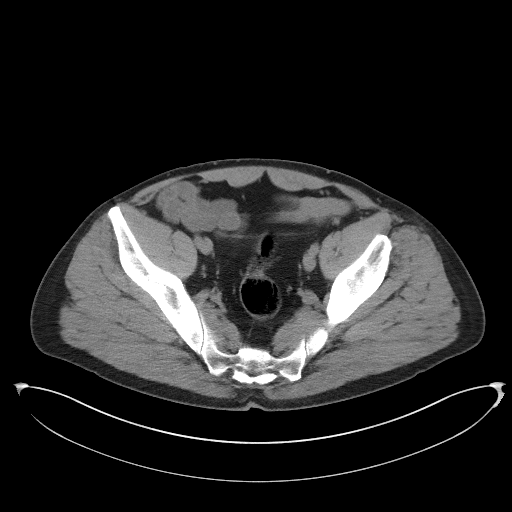
[im 38/98  soft-tissue]
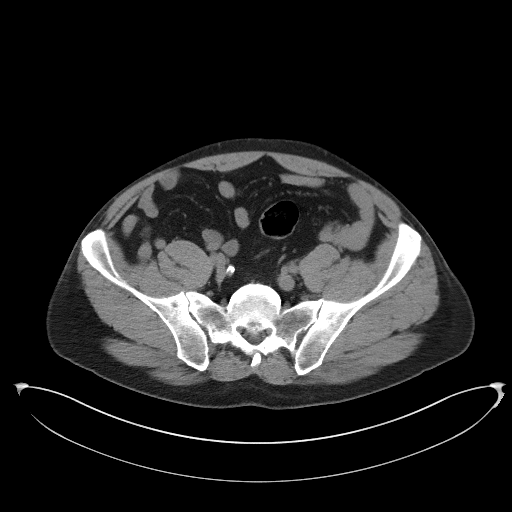
[im 45/98  soft-tissue]
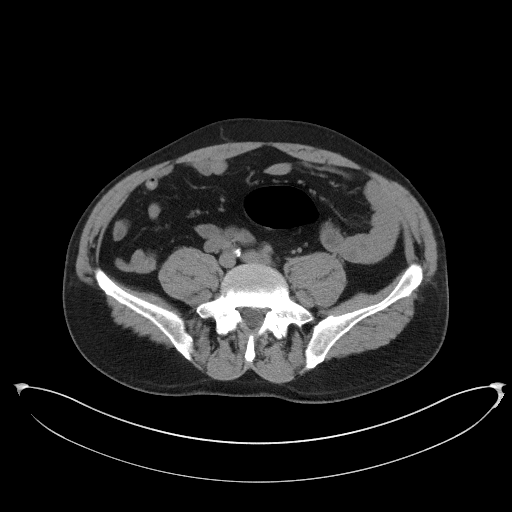
[im 53/98  soft-tissue]
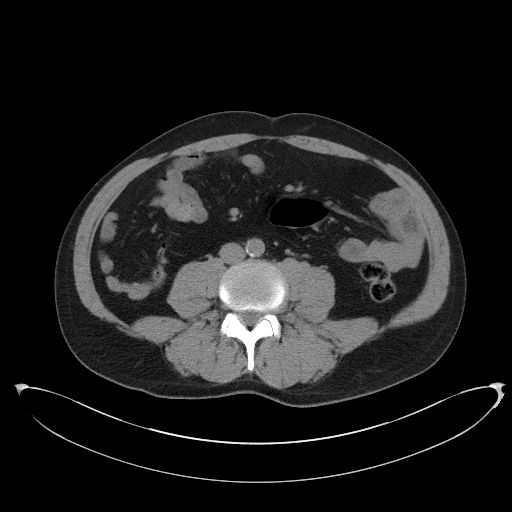
[im 60/98  soft-tissue]
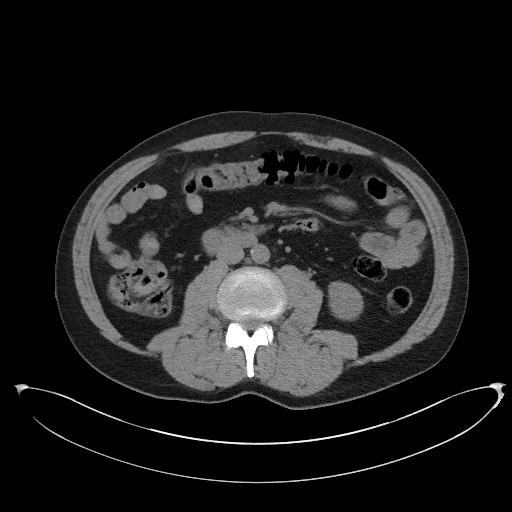
[im 68/98  soft-tissue]
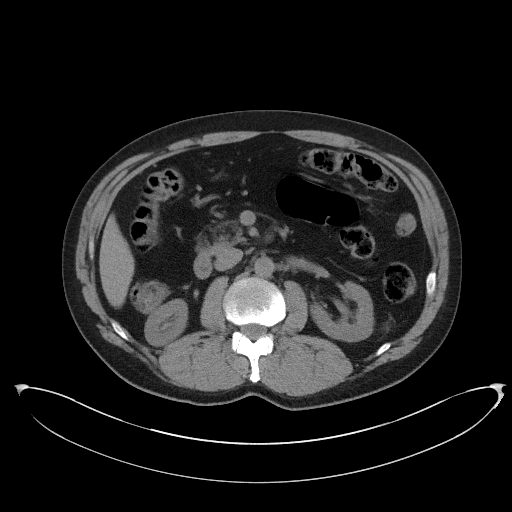
[im 68/98  bone]
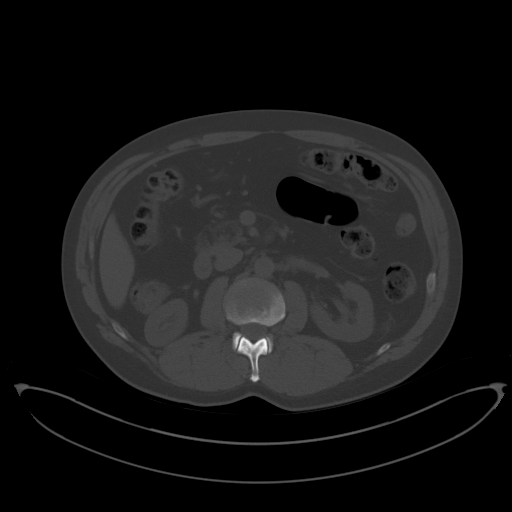
[im 75/98  soft-tissue]
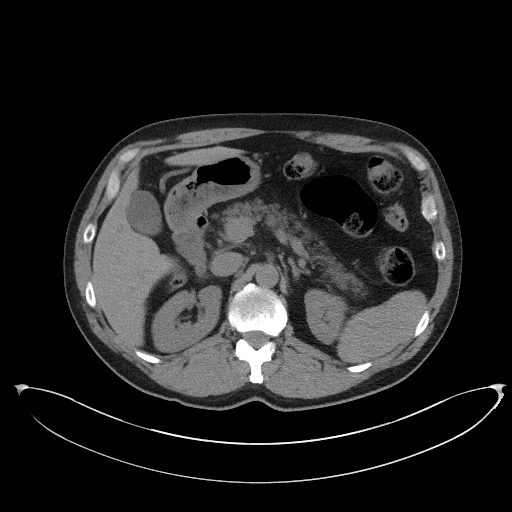
[im 83/98  soft-tissue]
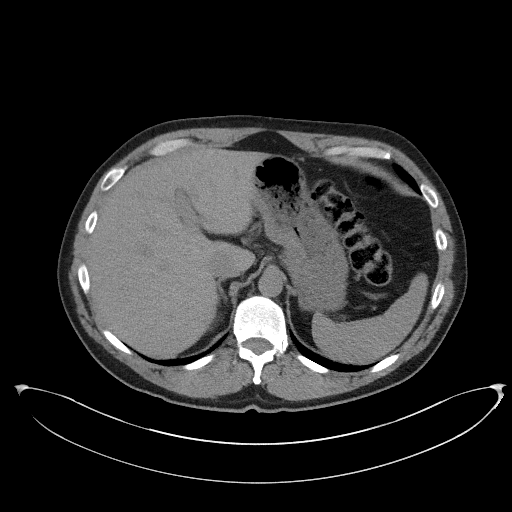
[im 90/98  soft-tissue]
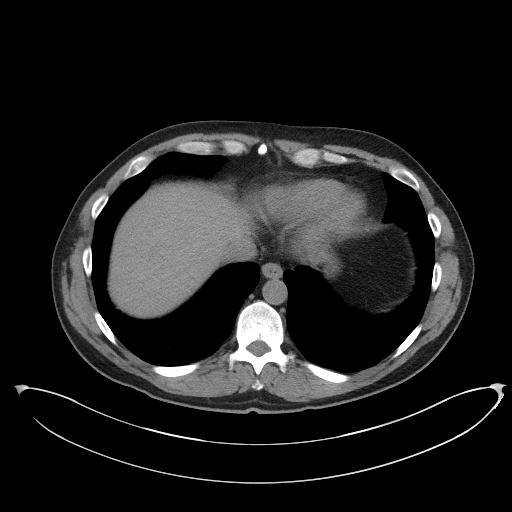

[Series 5: coronal st · coronal · 0.71mm/px · 3 of 90 slices shown]
[im 30/90  soft-tissue]
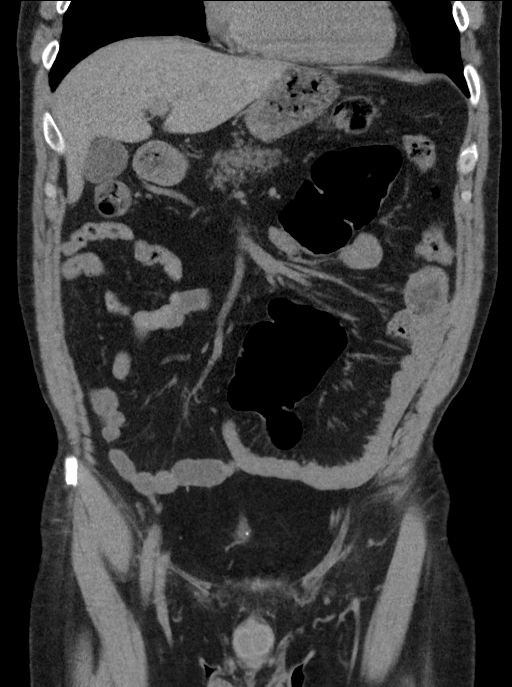
[im 40/90  soft-tissue]
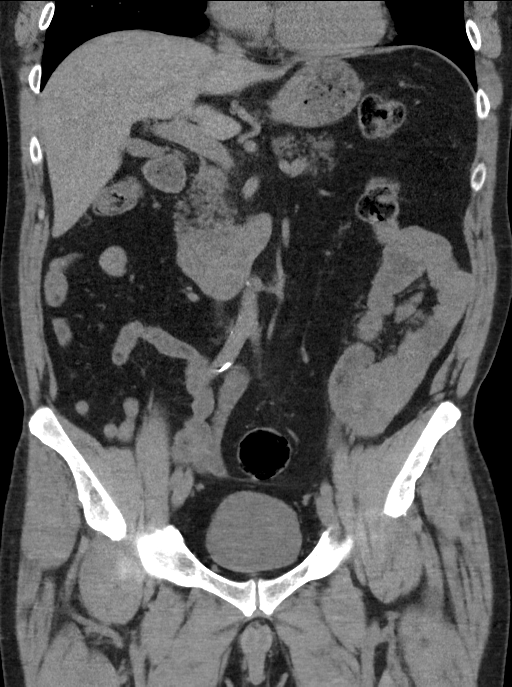
[im 50/90  soft-tissue]
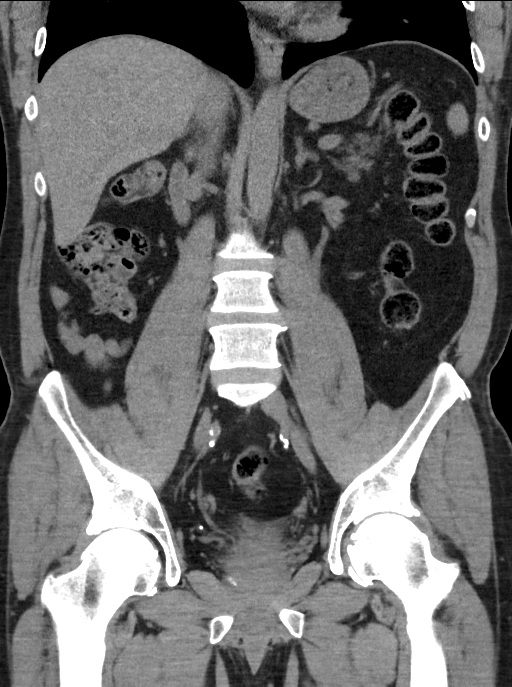

[15 of 46 positions shown; findings below may reference images not displayed]

FINDINGS: Lower chest: The lung bases are clear of acute process. No pleural
effusion or pulmonary lesions. The heart is normal in size. No
pericardial effusion. The distal esophagus and aorta are
unremarkable.

Hepatobiliary: No hepatic lesions are identified without contrast.
No intrahepatic biliary dilatation. The gallbladder is grossly
normal. No common bile duct dilatation.

Pancreas: No mass, inflammation or ductal dilatation. Prominent
fatty interstices.

Spleen: Normal size.  No focal lesions.

Adrenals/Urinary Tract: The adrenal glands are normal.

There are bilateral renal calculi. Small midpole right renal
calculus and several lower pole left renal calculi the largest
measuring approximately 9 mm. There is also an 8 mm calculus in the
renal pelvis which could be intermittently obstructing at the UPJ.
Minimal left-sided hydronephrosis. No distal ureteral calculi or
bladder calculi. No bladder lesions.

Stomach/Bowel: The stomach, duodenum, small bowel and colon are
grossly normal without oral contrast. No acute inflammatory changes,
mass lesions or obstructive findings. The terminal ileum is normal.
The appendix is normal.

Vascular/Lymphatic: Age advanced atherosclerotic calcifications
involving the aorta and iliac arteries. No focal aneurysm. No
mesenteric or retroperitoneal mass or adenopathy.

Reproductive: The prostate gland and seminal vesicles are
unremarkable.

Other: No pelvic mass or adenopathy. No free pelvic fluid
collections. No inguinal mass or adenopathy. No abdominal wall
hernia or subcutaneous lesions.

Musculoskeletal: No significant bony findings. Moderate bilateral
hip joint degenerative changes are noted for the patient's age.
IMPRESSION: 1. Bilateral renal calculi.
2. 8 mm calculus in the left renal pelvis which could be
intermittently obstructing at the UPJ. Minimal left-sided
hydronephrosis.
3. No other significant abdominal/pelvic findings, mass lesions or
adenopathy.
4. Age advanced atherosclerotic calcifications involving the aorta
and iliac arteries.
5. Aortic atherosclerosis.

Aortic Atherosclerosis ([id number]).

## 2022-06-15 ENCOUNTER — Other Ambulatory Visit (HOSPITAL_BASED_OUTPATIENT_CLINIC_OR_DEPARTMENT_OTHER): Payer: Self-pay

## 2022-06-15 ENCOUNTER — Encounter (HOSPITAL_BASED_OUTPATIENT_CLINIC_OR_DEPARTMENT_OTHER): Payer: Self-pay | Admitting: Radiology

## 2022-06-15 ENCOUNTER — Emergency Department (HOSPITAL_BASED_OUTPATIENT_CLINIC_OR_DEPARTMENT_OTHER): Payer: Commercial Managed Care - PPO

## 2022-06-15 ENCOUNTER — Other Ambulatory Visit: Payer: Self-pay

## 2022-06-15 ENCOUNTER — Emergency Department (HOSPITAL_BASED_OUTPATIENT_CLINIC_OR_DEPARTMENT_OTHER)
Admission: EM | Admit: 2022-06-15 | Discharge: 2022-06-15 | Disposition: A | Discharge status: Home / Self Care | Payer: Commercial Managed Care - PPO | Attending: Emergency Medicine | Admitting: Emergency Medicine

## 2022-06-15 DIAGNOSIS — K529 Noninfective gastroenteritis and colitis, unspecified: Secondary | ICD-10-CM | POA: Insufficient documentation

## 2022-06-15 DIAGNOSIS — R1030 Lower abdominal pain, unspecified: Secondary | ICD-10-CM | POA: Diagnosis present

## 2022-06-15 LAB — COMPREHENSIVE METABOLIC PANEL
ALT: 41 U/L (ref 0–44)
AST: 27 U/L (ref 15–41)
Albumin: 3.9 g/dL (ref 3.5–5.0)
Alkaline Phosphatase: 47 U/L (ref 38–126)
Anion gap: 8 (ref 5–15)
BUN: 25 mg/dL — ABNORMAL HIGH (ref 6–20)
CO2: 25 mmol/L (ref 22–32)
Calcium: 8.8 mg/dL — ABNORMAL LOW (ref 8.9–10.3)
Chloride: 103 mmol/L (ref 98–111)
Creatinine, Ser: 1.09 mg/dL (ref 0.61–1.24)
GFR, Estimated: >60 mL/min (ref >60)
Glucose, Bld: 150 mg/dL — ABNORMAL HIGH (ref 70–99)
Potassium: 3.9 mmol/L (ref 3.5–5.1)
Sodium: 136 mmol/L (ref 135–145)
Total Bilirubin: 1 mg/dL (ref 0.3–1.2)
Total Protein: 7.4 g/dL (ref 6.5–8.1)

## 2022-06-15 LAB — URINALYSIS, W/ REFLEX TO CULTURE (INFECTION SUSPECTED)
Bilirubin Urine: NEGATIVE
Glucose, UA: NEGATIVE mg/dL
Hgb urine dipstick: NEGATIVE
Ketones, ur: NEGATIVE mg/dL
Leukocytes,Ua: NEGATIVE
Nitrite: NEGATIVE
Protein, ur: NEGATIVE mg/dL
Specific Gravity, Urine: 1.01 (ref 1.005–1.030)
pH: 5.5 (ref 5.0–8.0)

## 2022-06-15 LAB — LIPASE, BLOOD: Lipase: 21 U/L (ref 11–51)

## 2022-06-15 LAB — CBC WITH DIFFERENTIAL/PLATELET
Abs Immature Granulocytes: 0.05 10*3/uL (ref 0.00–0.07)
Basophils Absolute: 0 10*3/uL (ref 0.0–0.1)
Basophils Relative: 0 %
Eosinophils Absolute: 0 10*3/uL (ref 0.0–0.5)
Eosinophils Relative: 0 %
HCT: 48.3 % (ref 39.0–52.0)
Hemoglobin: 16.5 g/dL (ref 13.0–17.0)
Immature Granulocytes: 0 %
Lymphocytes Relative: 8 %
Lymphs Abs: 1 10*3/uL (ref 0.7–4.0)
MCH: 30.8 pg (ref 26.0–34.0)
MCHC: 34.2 g/dL (ref 30.0–36.0)
MCV: 90.3 fL (ref 80.0–100.0)
Monocytes Absolute: 0.3 10*3/uL (ref 0.1–1.0)
Monocytes Relative: 2 %
Neutro Abs: 10.8 10*3/uL — ABNORMAL HIGH (ref 1.7–7.7)
Neutrophils Relative %: 90 %
Platelets: 202 10*3/uL (ref 150–400)
RBC: 5.35 MIL/uL (ref 4.22–5.81)
RDW: 12.4 % (ref 11.5–15.5)
WBC: 12.1 10*3/uL — ABNORMAL HIGH (ref 4.0–10.5)
nRBC: 0 % (ref 0.0–0.2)

## 2022-06-15 MED ORDER — IOHEXOL 300 MG/ML  SOLN
100.0000 mL | Freq: Once | INTRAMUSCULAR | Status: AC | PRN
  Administered 2022-06-15: 100 mL via INTRAVENOUS

## 2022-06-15 MED ORDER — ONDANSETRON 4 MG PO TBDP
4.0000 mg | ORAL_TABLET | ORAL | 0 refills | Status: DC | PRN
  Filled 2022-06-15: qty 10, 2d supply, fill #0

## 2022-06-15 MED ORDER — ONDANSETRON HCL 4 MG/2ML IJ SOLN
4.0000 mg | Freq: Once | INTRAMUSCULAR | Status: AC
  Administered 2022-06-15: 4 mg via INTRAVENOUS
  Filled 2022-06-15: qty 2

## 2022-06-15 MED ORDER — PANTOPRAZOLE SODIUM 40 MG IV SOLR
40.0000 mg | Freq: Once | INTRAVENOUS | Status: AC
  Administered 2022-06-15: 40 mg via INTRAVENOUS
  Filled 2022-06-15: qty 10

## 2022-06-15 MED ORDER — SODIUM CHLORIDE 0.9 % IV BOLUS
1000.0000 mL | Freq: Once | INTRAVENOUS | Status: AC
  Administered 2022-06-15: 1000 mL via INTRAVENOUS

## 2022-06-15 NOTE — Discharge Instructions (Signed)
Drink plenty of fluids.  Eat a bland diet only.  Follow-up with your primary care doctor for recheck.  Return to the emergency room if you have any worsening symptoms.

## 2022-06-15 NOTE — ED Provider Notes (Signed)
  Physical Exam  BP (!) 150/92   Pulse 62   Temp 98.9 F (37.2 C) (Oral)   Resp 18   Ht 5\' 9"  (1.753 m)   Wt 85.7 kg   SpO2 97%   BMI 27.91 kg/m     Procedures  Procedures  ED Course / MDM    Medical Decision Making Amount and/or Complexity of Data Reviewed Labs: ordered. Radiology: ordered.  Risk Prescription drug management.   69M with abd pain, n/v, has colitis on CT imaging. Just started Amoxicillin outpatient. Waiting on a UA. Plan for BRAT diet, Rx for Zofran, and DC.    UA resulted negative, pt stable for DC.       Regan Lemming, MD 06/15/22 [phone]

## 2022-06-15 NOTE — ED Triage Notes (Signed)
Patient presents to ED via POV from home. Here with abdominal pain, nausea, vomiting and diarrhea. Reports he has been on amoxicillin since Thursday for a root canal. Expresses concerns that his symptoms are related to the medication.

## 2022-06-15 NOTE — ED Provider Notes (Signed)
[facility] [facility] Provider Note   CSN: [id number] Arrival date & time: 06/15/22  1225     History  Chief Complaint  Patient presents with   Abdominal Pain    Danny Mendez is a 57 y.o. male.  Patient is a 57 year old male who presents with abdominal pain.  He started taking amoxicillin last week for root canal.  Last night he started having diarrhea.  He has had associated nausea and vomiting.  His stool is mostly watery but occasionally he will have some specks of blood in the toilet.  No large amount of blood.  No known fevers.  No urinary symptoms.  His emesis is nonbloody and nonbilious.  He has associated abdominal pain, mostly across the lower part of his abdomen.  No known history of diverticulosis.       Home Medications Prior to Admission medications   Medication Sig Start Date End Date Taking? Authorizing Provider  ondansetron (ZOFRAN-ODT) 4 MG disintegrating tablet 4mg  ODT q4 hours prn nausea/vomit 06/15/22  Yes Malvin Johns, MD  acetaminophen (TYLENOL) 500 MG tablet Take 500 mg by mouth every 6 (six) hours as needed. Alternates 500 mg and 1000 mg    [provider]  oxybutynin (DITROPAN-XL) 10 MG 24 hr tablet Take 10 mg by mouth daily. 02/29/20   [provider]  tamsulosin (FLOMAX) 0.4 MG CAPS capsule Take 1 capsule (0.4 mg total) by mouth daily. 03/25/20   Robley Fries, MD      Allergies    Niacin and related    Review of Systems   Review of Systems  Constitutional:  Negative for chills, diaphoresis, fatigue and fever.  HENT:  Negative for congestion, rhinorrhea and sneezing.   Eyes: Negative.   Respiratory:  Negative for cough, chest tightness and shortness of breath.   Cardiovascular:  Negative for chest pain and leg swelling.  Gastrointestinal:  Positive for abdominal pain, blood in stool, diarrhea, nausea and vomiting.  Genitourinary:  Negative for difficulty urinating, flank pain, frequency and  hematuria.  Musculoskeletal:  Negative for arthralgias and back pain.  Skin:  Negative for rash.  Neurological:  Negative for dizziness, speech difficulty, weakness, numbness and headaches.    Physical Exam Updated Vital Signs BP (!) 150/92   Pulse 62   Temp 98.9 F (37.2 C) (Oral)   Resp 18   Ht 5\' 9"  (1.753 m)   Wt 85.7 kg   SpO2 97%   BMI 27.91 kg/m  Physical Exam Constitutional:      Appearance: He is well-developed.  HENT:     Head: Normocephalic and atraumatic.  Eyes:     Pupils: Pupils are equal, round, and reactive to light.  Cardiovascular:     Rate and Rhythm: Normal rate and regular rhythm.     Heart sounds: Normal heart sounds.  Pulmonary:     Effort: Pulmonary effort is normal. No respiratory distress.     Breath sounds: Normal breath sounds. No wheezing or rales.  Chest:     Chest wall: No tenderness.  Abdominal:     General: Bowel sounds are normal.     Palpations: Abdomen is soft.     Tenderness: There is abdominal tenderness in the right lower quadrant and left lower quadrant. There is no guarding or rebound.  Musculoskeletal:        General: Normal range of motion.     Cervical back: Normal range of motion and neck supple.  Lymphadenopathy:  Cervical: No cervical adenopathy.  Skin:    General: Skin is warm and dry.     Findings: No rash.  Neurological:     Mental Status: He is alert and oriented to person, place, and time.     ED Results / Procedures / Treatments   Labs (all labs ordered are listed, but only abnormal results are displayed) Labs Reviewed  COMPREHENSIVE METABOLIC PANEL - Abnormal; Notable for the following components:      Result Value   Glucose, Bld 150 (*)    BUN 25 (*)    Calcium 8.8 (*)    All other components within normal limits  CBC WITH DIFFERENTIAL/PLATELET - Abnormal; Notable for the following components:   WBC 12.1 (*)    Neutro Abs 10.8 (*)    All other components within normal limits  LIPASE, BLOOD   URINALYSIS, W/ REFLEX TO CULTURE (INFECTION SUSPECTED)    EKG None  Radiology CT Abdomen Pelvis W Contrast  Result Date: 06/15/2022 CLINICAL DATA:  Left lower quadrant abdominal pain EXAM: CT ABDOMEN AND PELVIS WITH CONTRAST TECHNIQUE: Multidetector CT imaging of the abdomen and pelvis was performed using the standard protocol following bolus administration of intravenous contrast. RADIATION DOSE REDUCTION: This exam was performed according to the departmental dose-optimization program which includes automated exposure control, adjustment of the mA and/or kV according to patient size and/or use of iterative reconstruction technique. CONTRAST:  167mL OMNIPAQUE IOHEXOL 300 MG/ML  SOLN COMPARISON:  CT abdomen and pelvis dated November 04, 2021 FINDINGS: Lower chest: No acute abnormality. Hepatobiliary: No focal liver abnormality is seen. No gallstones, gallbladder wall thickening, or biliary dilatation. Pancreas: Unremarkable. No pancreatic ductal dilatation or surrounding inflammatory changes. Spleen: Normal in size without focal abnormality. Adrenals/Urinary Tract: Bilateral adrenal glands are unremarkable. No hydronephrosis. Bilateral nonobstructing renal stones. Bladder is unremarkable. Stomach/Bowel: Moderate wall thickening of the ascending and transverse colon with minimal adjacent inflammatory change. Normal appendix. Evidence of obstruction. Normal stomach. Vascular/Lymphatic: Aortic atherosclerosis. No enlarged abdominal or pelvic lymph nodes. Reproductive: Prostate is unremarkable. Other: No abdominal wall hernia or abnormality. No abdominopelvic ascites. Musculoskeletal: No acute or significant osseous findings. IMPRESSION: 1. Moderate wall thickening of the ascending and transverse colon with minimal adjacent inflammatory change, findings are consistent with colitis. 2. Bilateral nonobstructing renal stones. 3. Aortic Atherosclerosis (ICD10-I70.0). Electronically Signed   By: Yetta Glassman M.D.    On: 06/15/2022 14:18    Procedures Procedures    Medications Ordered in ED Medications  sodium chloride 0.9 % bolus 1,000 mL (0 mLs Intravenous Stopped 06/15/22 1426)  ondansetron (ZOFRAN) injection 4 mg (4 mg Intravenous Given 06/15/22 1321)  pantoprazole (PROTONIX) injection 40 mg (40 mg Intravenous Given 06/15/22 1324)  iohexol (OMNIPAQUE) 300 MG/ML solution 100 mL (100 mLs Intravenous Contrast Given 06/15/22 1358)    ED Course/ Medical Decision Making/ A&P                             Medical Decision Making Amount and/or Complexity of Data Reviewed Labs: ordered. Radiology: ordered.  Risk Prescription drug management.   Patient is a 57 year old who presents with abdominal pain associated with diarrhea and nausea and vomiting.  He recently started taking amoxicillin for a root canal.  He has some tenderness across his lower abdomen.  His labs are reviewed and are nonconcerning.  Urinalysis is still pending.  CT scan shows evidence of colitis.  He does not have any significant concerns for ischemic  colitis.  No evidence of diverticulitis.  Doubt that this is a bacterial infection.  I do not see an indication for antibiotics.  He was given IV fluids as well as antiemetics and Protonix in the ED.  He is feeling much better after this.  Will try p.o. trial.  his symptoms have greatly improved.  He was advised in a bland diet.  He was advised to make sure that he has increased fluid intake.  If he is able to tolerate this, can be discharged with follow-up with his primary care provider.  Dr. Kellie Simmering to check on results of U/A and likely discharge after this assuming he does ok with the PO trial.  Final Clinical Impression(s) / ED Diagnoses Final diagnoses:  Colitis    Rx / DC Orders ED Discharge Orders          Ordered    ondansetron (ZOFRAN-ODT) 4 MG disintegrating tablet        06/15/22 1516              Malvin Johns, MD 06/15/22 1521

## 2022-06-15 NOTE — ED Notes (Signed)
Pt provided crackers and ginger ale for PO challenge

## 2022-09-05 ENCOUNTER — Emergency Department (HOSPITAL_BASED_OUTPATIENT_CLINIC_OR_DEPARTMENT_OTHER)
Admission: EM | Admit: 2022-09-05 | Discharge: 2022-09-05 | Disposition: A | Discharge status: Home / Self Care | Payer: Commercial Managed Care - PPO | Attending: Emergency Medicine | Admitting: Emergency Medicine

## 2022-09-05 ENCOUNTER — Emergency Department (HOSPITAL_BASED_OUTPATIENT_CLINIC_OR_DEPARTMENT_OTHER): Payer: Commercial Managed Care - PPO

## 2022-09-05 ENCOUNTER — Encounter (HOSPITAL_BASED_OUTPATIENT_CLINIC_OR_DEPARTMENT_OTHER): Payer: Self-pay

## 2022-09-05 ENCOUNTER — Other Ambulatory Visit: Payer: Self-pay

## 2022-09-05 DIAGNOSIS — N132 Hydronephrosis with renal and ureteral calculous obstruction: Secondary | ICD-10-CM | POA: Diagnosis not present

## 2022-09-05 DIAGNOSIS — N201 Calculus of ureter: Secondary | ICD-10-CM

## 2022-09-05 DIAGNOSIS — R109 Unspecified abdominal pain: Secondary | ICD-10-CM | POA: Diagnosis present

## 2022-09-05 LAB — COMPREHENSIVE METABOLIC PANEL
ALT: 29 U/L (ref 0–44)
AST: 30 U/L (ref 15–41)
Albumin: 4 g/dL (ref 3.5–5.0)
Alkaline Phosphatase: 54 U/L (ref 38–126)
Anion gap: 12 (ref 5–15)
BUN: 19 mg/dL (ref 6–20)
CO2: 23 mmol/L (ref 22–32)
Calcium: 9.6 mg/dL (ref 8.9–10.3)
Chloride: 103 mmol/L (ref 98–111)
Creatinine, Ser: 1.17 mg/dL (ref 0.61–1.24)
GFR, Estimated: >60 mL/min (ref >60)
Glucose, Bld: 173 mg/dL — ABNORMAL HIGH (ref 70–99)
Potassium: 3.7 mmol/L (ref 3.5–5.1)
Sodium: 138 mmol/L (ref 135–145)
Total Bilirubin: 0.5 mg/dL (ref 0.3–1.2)
Total Protein: 7.4 g/dL (ref 6.5–8.1)

## 2022-09-05 LAB — CBC WITH DIFFERENTIAL/PLATELET
Abs Immature Granulocytes: 0.04 10*3/uL (ref 0.00–0.07)
Basophils Absolute: 0 10*3/uL (ref 0.0–0.1)
Basophils Relative: 0 %
Eosinophils Absolute: 0 10*3/uL (ref 0.0–0.5)
Eosinophils Relative: 0 %
HCT: 44.5 % (ref 39.0–52.0)
Hemoglobin: 15.6 g/dL (ref 13.0–17.0)
Immature Granulocytes: 0 %
Lymphocytes Relative: 11 %
Lymphs Abs: 1.3 10*3/uL (ref 0.7–4.0)
MCH: 30.8 pg (ref 26.0–34.0)
MCHC: 35.1 g/dL (ref 30.0–36.0)
MCV: 87.8 fL (ref 80.0–100.0)
Monocytes Absolute: 0.5 10*3/uL (ref 0.1–1.0)
Monocytes Relative: 4 %
Neutro Abs: 10.2 10*3/uL — ABNORMAL HIGH (ref 1.7–7.7)
Neutrophils Relative %: 85 %
Platelets: 207 10*3/uL (ref 150–400)
RBC: 5.07 MIL/uL (ref 4.22–5.81)
RDW: 12.5 % (ref 11.5–15.5)
WBC: 12 10*3/uL — ABNORMAL HIGH (ref 4.0–10.5)
nRBC: 0 % (ref 0.0–0.2)

## 2022-09-05 LAB — URINALYSIS, ROUTINE W REFLEX MICROSCOPIC
Bilirubin Urine: NEGATIVE
Glucose, UA: 100 mg/dL — AB
Ketones, ur: NEGATIVE mg/dL
Leukocytes,Ua: NEGATIVE
Nitrite: NEGATIVE
Protein, ur: 30 mg/dL — AB
Specific Gravity, Urine: 1.02 (ref 1.005–1.030)
pH: 6 (ref 5.0–8.0)

## 2022-09-05 LAB — URINALYSIS, MICROSCOPIC (REFLEX)

## 2022-09-05 LAB — LIPASE, BLOOD: Lipase: 37 U/L (ref 11–51)

## 2022-09-05 MED ORDER — OXYCODONE-ACETAMINOPHEN 5-325 MG PO TABS: 1.0000 | ORAL_TABLET | Freq: Four times a day (QID) | ORAL | 0 refills | Status: DC | PRN

## 2022-09-05 MED ORDER — SENNOSIDES-DOCUSATE SODIUM 8.6-50 MG PO TABS: 1.0000 | ORAL_TABLET | Freq: Every evening | ORAL | 0 refills | Status: DC | PRN

## 2022-09-05 MED ORDER — IOHEXOL 300 MG/ML  SOLN
100.0000 mL | Freq: Once | INTRAMUSCULAR | Status: AC | PRN
  Administered 2022-09-05: 100 mL via INTRAVENOUS

## 2022-09-05 MED ORDER — IBUPROFEN 800 MG PO TABS: 800.0000 mg | ORAL_TABLET | Freq: Three times a day (TID) | ORAL | 0 refills | Status: DC | PRN

## 2022-09-05 MED ORDER — SODIUM CHLORIDE 0.9 % IV BOLUS
500.0000 mL | Freq: Once | INTRAVENOUS | Status: AC
  Administered 2022-09-05: 500 mL via INTRAVENOUS

## 2022-09-05 MED ORDER — ONDANSETRON HCL 4 MG/2ML IJ SOLN
4.0000 mg | Freq: Once | INTRAMUSCULAR | Status: AC
  Administered 2022-09-05: 4 mg via INTRAVENOUS
  Filled 2022-09-05: qty 2

## 2022-09-05 MED ORDER — MORPHINE SULFATE (PF) 4 MG/ML IV SOLN
4.0000 mg | Freq: Once | INTRAVENOUS | Status: AC
  Administered 2022-09-05: 4 mg via INTRAVENOUS
  Filled 2022-09-05: qty 1

## 2022-09-05 MED ORDER — ONDANSETRON 4 MG PO TBDP: 4.0000 mg | ORAL_TABLET | Freq: Three times a day (TID) | ORAL | 0 refills | Status: DC | PRN

## 2022-09-05 MED ORDER — TAMSULOSIN HCL 0.4 MG PO CAPS: 0.4000 mg | ORAL_CAPSULE | Freq: Every day | ORAL | 0 refills | Status: DC

## 2022-09-05 NOTE — ED Provider Notes (Signed)
Emergency Department Provider Note   I have reviewed the triage vital signs and the nursing notes.   HISTORY  Chief Complaint Abdominal Pain and Emesis   HPI Danny Mendez is a 57 y.o. male with PMH of kidney stones presents to the emergency department for ration of right-sided abdominal pain and vomiting.  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  Symptoms began suddenly this morning with right-sided pain multiple episodes of nonbloody emesis.  No pain into the chest.  Denies any dysuria or hematuria. No fever.    Past Medical History:  Diagnosis Date   Cast in place on lower extremity 03/07/2020   using crutches   History of kidney stones    Renal disorder    Wears glasses     Review of Systems  Constitutional: No fever/chills Cardiovascular: Denies chest pain. Respiratory: Denies shortness of breath. Gastrointestinal: Positive right abdominal pain. Positive nausea and vomiting.  No diarrhea.  No constipation. Genitourinary: Negative for dysuria. Musculoskeletal: Negative for back pain. Skin: Negative for rash. Neurological: Negative for headaches, focal weakness or numbness.  ____________________________________________   PHYSICAL EXAM:  VITAL SIGNS: ED Triage Vitals  Enc Vitals Group     BP 09/05/22 0928 (!) 161/84     Pulse Rate 09/05/22 0928 61     Resp 09/05/22 0928 20     Temp 09/05/22 0928 97.7 F (36.5 C)     Temp Source 09/05/22 0928 Oral     SpO2 09/05/22 0928 99 %     Weight 09/05/22 0924 190 lb (86.2 kg)     Height 09/05/22 0924 5\' 9"  (1.753 m)   Constitutional: Alert and oriented. Patient grimacing and appears uncomfortable.  Eyes: Conjunctivae are normal.  Head: Atraumatic. Nose: No congestion/rhinnorhea. Mouth/Throat: Mucous membranes are moist.  Neck: No stridor.   Cardiovascular: Normal rate, regular rhythm. Good peripheral circulation. Grossly normal heart sounds.   Respiratory: Normal  respiratory effort.  No retractions. Lungs CTAB. Gastrointestinal: Soft with mild tenderness in the right abdomen diffusely. No peritonitis. Negative Murphy's sign. No distention.  Musculoskeletal: No lower extremity tenderness nor edema. No gross deformities of extremities. Neurologic:  Normal speech and language. No gross focal neurologic deficits are appreciated.  Skin:  Skin is warm, dry and intact. No rash noted.  ____________________________________________   LABS (all labs ordered are listed, but only abnormal results are displayed)  Labs Reviewed  COMPREHENSIVE METABOLIC PANEL - Abnormal; Notable for the following components:      Result Value   Glucose, Bld 173 (*)    All other components within normal limits  CBC WITH DIFFERENTIAL/PLATELET - Abnormal; Notable for the following components:   WBC 12.0 (*)    Neutro Abs 10.2 (*)    All other components within normal limits  URINALYSIS, ROUTINE W REFLEX MICROSCOPIC - Abnormal; Notable for the following components:   Glucose, UA 100 (*)    Hgb urine dipstick LARGE (*)    Protein, ur 30 (*)    All other components within normal limits  URINALYSIS, MICROSCOPIC (REFLEX) - Abnormal; Notable for the following components:   Bacteria, UA FEW (*)    All other components within normal limits  LIPASE, BLOOD   ____________________________________________  RADIOLOGY  CT ABDOMEN PELVIS W CONTRAST  Addendum Date: 09/05/2022   ADDENDUM REPORT: 09/05/2022 12:39 FINDINGS: Lower chest: No pleural or pericardial effusion. Small hiatal hernia. Hepatobiliary: No focal liver abnormality is seen. No gallstones, gallbladder wall thickening, or biliary  dilatation. Pancreas: Unremarkable. No pancreatic ductal dilatation or surrounding inflammatory changes. Spleen: Normal in size without focal abnormality. Adrenals/Urinary Tract: No adrenal mass. Slightly delayed right renal enhancement without focal renal lesion. Left urolithiasis, largest stone 8 mm  in the lower pole. Mild right hydronephrosis and ureterectasis down to the level of a 4 mm proximal ureteral calculus. Stomach/Bowel: Stomach is nondistended, unremarkable. Small bowel decompressed. Normal appendix. Colon is partially distended by gas and fecal material, without acute finding. Vascular/Lymphatic: Moderate scattered aortoiliac calcified atheromatous plaque without aneurysm or evident stenosis. Portal vein patent. No abdominal or pelvic adenopathy. Reproductive: Prostate enlargement with central coarse calcifications. Other: Bilateral pelvic phleboliths.  No ascites.  No free air. Musculoskeletal: Mild bilateral hip DJD. No fracture or worrisome bone lesion. IMPRESSION: 1. Mild right hydronephrosis secondary to 4 mm proximal ureteral calculus. 2. Left urolithiasis. 3. Prostate enlargement. 4.  Aortic Atherosclerosis (ICD10-I70.0). Electronically Signed   By: Corlis Leak M.D.   On: 09/05/2022 12:39   Result Date: 09/05/2022 CLINICAL DATA:  Abdominal pain, acute, nonlocalized EXAM: CT ABDOMEN AND PELVIS WITH CONTRAST TECHNIQUE: Multidetector CT imaging of the abdomen and pelvis was performed using the standard protocol following bolus administration of intravenous contrast. RADIATION DOSE REDUCTION: This exam was performed according to the departmental dose-optimization program which includes automated exposure control, adjustment of the mA and/or kV according to patient size and/or use of iterative reconstruction technique. CONTRAST:  OMNIPAQUE IOHEXOL 300 MG/ML  SOLN COMPARISON:  06/15/2022 FINDINGS: Lower chest: Hepatobiliary: Pancreas: Spleen: Adrenals/Urinary Tract: Stomach/Bowel: Vascular/Lymphatic: Reproductive: Other: Musculoskeletal: Electronically Signed: By: Corlis Leak M.D. On: 09/05/2022 10:56    ____________________________________________   PROCEDURES  Procedure(s) performed:   Procedures  None  ____________________________________________   INITIAL IMPRESSION /  ASSESSMENT AND PLAN / ED COURSE  Pertinent labs & imaging results that were available during my care of the patient were reviewed by me and considered in my medical decision making (see chart for details).   This patient is Presenting for Evaluation of abdominal pain, which does require a range of treatment options, and is a complaint that involves a high risk of morbidity and mortality.  The Differential Diagnoses includes but is not exclusive to acute cholecystitis, intrathoracic causes for epigastric abdominal pain, gastritis, duodenitis, pancreatitis, small bowel or large bowel obstruction, abdominal aortic aneurysm, hernia, gastritis, etc.   Critical Interventions-    Medications  sodium chloride 0.9 % bolus 500 mL (0 mLs Intravenous Stopped 09/05/22 1015)  ondansetron (ZOFRAN) injection 4 mg (4 mg Intravenous Given 09/05/22 0944)  morphine (PF) 4 MG/ML injection 4 mg (4 mg Intravenous Given 09/05/22 0944)  iohexol (OMNIPAQUE) 300 MG/ML solution 100 mL (100 mLs Intravenous Contrast Given 09/05/22 1037)    Reassessment after intervention: Pain/nausea improved.    Clinical Laboratory Tests Ordered, included CMP without AKI. Normal LFTs. CBC with leukocytosis. No anemia. Lipase negative. UA without infection.   Radiologic Tests Ordered, included CT abdomen/pelvis. I independently interpreted the images and agree with radiology interpretation.   Cardiac Monitor Tracing which shows NSR.    Social Determinants of Health Risk patient is not an active smoker.   Medical Decision Making: Summary:  Patient presents to the emergency department for evaluation of abdominal pain with vomiting.  Mild right-sided abdominal tenderness.  Negative Murphy sign.  Differential as above.  Plan for CT imaging with contrast along with pain/nausea management and reassess.  Reevaluation with update and discussion with patient. Ureteral stone noted on CT. Pain well controlled here. No evidence of  UTI. Patient is  established with Urology. Plan for continued pain mgmt and plan for Urology follow up in the coming week.   Considered admission but pain symptoms well controlled and no indication for emergent Urology evaluation.   Patient's presentation is most consistent with acute presentation with potential threat to life or bodily function.   Disposition: discharge  ____________________________________________  FINAL CLINICAL IMPRESSION(S) / ED DIAGNOSES  Final diagnoses:  Right ureteral stone     NEW OUTPATIENT MEDICATIONS STARTED DURING THIS VISIT:  Discharge Medication List as of 09/05/2022 12:52 PM     START taking these medications   Details  ibuprofen (ADVIL) 800 MG tablet Take 1 tablet (800 mg total) by mouth every 8 (eight) hours as needed for moderate pain., Starting Sun 09/05/2022, Normal    oxyCODONE-acetaminophen (PERCOCET/ROXICET) 5-325 MG tablet Take 1 tablet by mouth every 6 (six) hours as needed for severe pain., Starting Sun 09/05/2022, Normal    senna-docusate (SENOKOT-S) 8.6-50 MG tablet Take 1 tablet by mouth at bedtime as needed for mild constipation or moderate constipation., Starting Sun 09/05/2022, Normal        Note:  This document was prepared using Dragon voice recognition software and may include unintentional dictation errors.  Alona Bene, MD, [facility] Emergency Medicine    Makala Fetterolf, Arlyss Repress, MD 09/05/22 1450

## 2022-09-05 NOTE — ED Triage Notes (Signed)
Patient having ABD Pain that started today. He has had vomiting. He denied fever.

## 2022-09-05 NOTE — ED Notes (Signed)
   09/05/22 1018  Oxygen Therapy/Pulse Ox  O2 Device Nasal Cannula  O2 Therapy Oxygen  O2 Flow Rate (L/min) 2 L/min  SpO2 97 %   Desat (85%) after pain meds, no distress noted placed on 2LPM Danny Mendez, will monitor.

## 2022-09-05 NOTE — Discharge Instructions (Signed)
You were seen in the emergency room today with kidney stone.  Please keep your Wednesday follow-up appointment with your urologist.  Have called in some pain medications along with other medicines to help your symptoms.  Percocet will cause constipation and so you should take the constipation medication as prescribed.  If you develop worsening pain or fever you should return to the emergency department.

## 2023-10-28 ENCOUNTER — Other Ambulatory Visit: Payer: Self-pay | Admitting: Urology

## 2023-11-24 NOTE — Progress Notes (Signed)
 COVID Vaccine received:  []  No [x]  Yes Date of any COVID positive Test in last 90 days:  PCP - Charmaine Bright, PA-C at [place] [phone]  Cardiologist -   Chest x-ray -  EKG - PST   Stress Test -  ECHO -  Cardiac Cath -  CT Coronary Calcium score:   Bowel Prep - [x]  No  []   Yes ______  Pacemaker / ICD device []  No []  Yes   Spinal Cord Stimulator:[]  No []  Yes       History of Sleep Apnea? []  No []  Yes  Study 04-04-23 at [facility]  CPAP used?- []  No []  Yes    Patient has: []  NO Hx DM   []  Pre-DM   []  DM1  []   DM2 Does the patient monitor blood sugar?   []  N/A   []  No []  Yes  Last A1c was:        on      Does patient have a [employer] or Dexcom? []  No []  Yes   Fasting Blood Sugar Ranges-  Checks Blood Sugar _____ times a day  Blood Thinner / Instructions:  none Aspirin Instructions:  none  ERAS Protocol Ordered: [x]  No  []  Yes Patient is to be NPO after: MN Prior  Dental hx: []  Dentures:  []  N/A      []  Bridge or Partial:                   []  Loose or Damaged teeth:   Comments:   Activity level: Able to walk up 2 flights of stairs without becoming significantly short of breath or having chest pain?  []  No   []    Yes  Patient can perform ADLs without assistance. []  No   []   Yes  Anesthesia review: HTN no meds, ? DM   Patient denies any S&S of respiratory illness or Covid - no shortness of breath, fever, cough or chest pain at PAT appointment.  Patient verbalized understanding and agreement to the Pre-Surgical Instructions that were given to them at this PAT appointment. Patient was also educated of the need to review these PAT instructions again prior to his surgery.I reviewed the appropriate phone numbers to call if they have any and questions or concerns.

## 2023-11-24 NOTE — Patient Instructions (Signed)
 SURGICAL WAITING ROOM VISITATION Patients having surgery or a procedure may have no more than 2 support people in the waiting area - these visitors may rotate in the visitor waiting room.   If the patient needs to stay at the hospital during part of their recovery, the visitor guidelines for inpatient rooms apply.  PRE-OP VISITATION  Pre-op nurse will coordinate an appropriate time for 1 support person to accompany the patient in pre-op.  This support person may not rotate.  This visitor will be contacted when the time is appropriate for the visitor to come back in the pre-op area.  Please refer to the [facility] website for the visitor guidelines for Inpatients (after your surgery is over and you are in a regular room).  You are not required to quarantine at this time prior to your surgery. However, you must do this: Hand Hygiene often Do NOT share personal items Notify your provider if you are in close contact with someone who has COVID or you develop fever 100.4 or greater, new onset of sneezing, cough, sore throat, shortness of breath or body aches.  If you test positive for Covid or have been in contact with anyone that has tested positive in the last 10 days please notify you surgeon.    Your procedure is scheduled on:  Friday  December 02, 2023  Report to [facility] Main Entrance: [place] entrance where the [employer] is available.   Report to admitting at: 05:15 AM  Call this number if you have any questions or problems the morning of surgery [phone]  DO NOT EAT OR DRINK ANYTHING AFTER MIDNIGHT THE NIGHT PRIOR TO YOUR SURGERY / PROCEDURE.   FOLLOW  ANY ADDITIONAL PRE OP INSTRUCTIONS YOU RECEIVED FROM YOUR SURGEON'S OFFICE!!!   Oral Hygiene is also important to reduce your risk of infection.        Remember - BRUSH YOUR TEETH THE MORNING OF SURGERY WITH YOUR REGULAR TOOTHPASTE  Do NOT smoke after Midnight the night before surgery.  STOP TAKING all  Vitamins, Herbs and supplements 1 week before your surgery.   Take ONLY these medicines the morning of surgery with A SIP OF WATER : none                    You may not have any metal on your body including  jewelry, and body piercing  Do not wear lotions, powders, cologne, or deodorant  Men may shave face and neck.  Contacts, Hearing Aids, dentures or bridgework may not be worn into surgery. DENTURES WILL BE REMOVED PRIOR TO SURGERY PLEASE DO NOT APPLY Poly grip OR ADHESIVES!!!   Patients discharged on the day of surgery will not be allowed to drive home.  Someone NEEDS to stay with you for the first 24 hours after anesthesia.  Do not bring your home medications to the hospital. The Pharmacy will dispense medications listed on your medication list to you during your admission in the Hospital.  Please read over the following fact sheets you were given: IF YOU HAVE QUESTIONS ABOUT YOUR PRE-OP INSTRUCTIONS, PLEASE CALL [phone].   [place] - Preparing for Surgery Before surgery, you can play an important role.  Because skin is not sterile, your skin needs to be as free of germs as possible.  You can reduce the number of germs on your skin by washing with CHG (chlorahexidine gluconate) soap before surgery.  CHG is an antiseptic cleaner which kills germs and bonds with  the skin to continue killing germs even after washing. Please DO NOT use if you have an allergy to CHG or antibacterial soaps.  If your skin becomes reddened/irritated stop using the CHG and inform your nurse when you arrive at Short Stay. Do not shave (including legs and underarms) for at least 48 hours prior to the first CHG shower.  You may shave your face/neck.  Please follow these instructions carefully:  1.  Shower with CHG Soap the night before surgery and the  morning of surgery.  2.  If you choose to wash your hair, wash your hair first as usual with your normal  shampoo.  3.  After you shampoo, rinse your  hair and body thoroughly to remove the shampoo.                             4.  Use CHG as you would any other liquid soap.  You can apply chg directly to the skin and wash.  Gently with a scrungie or clean washcloth.  5.  Apply the CHG Soap to your body ONLY FROM THE NECK DOWN.   Do not use on face/ open                           Wound or open sores. Avoid contact with eyes, ears mouth and genitals (private parts).                       Wash face,  Genitals (private parts) with your normal soap.             6.  Wash thoroughly, paying special attention to the area where your  surgery  will be performed.  7.  Thoroughly rinse your body with warm water  from the neck down.  8.  DO NOT shower/wash with your normal soap after using and rinsing off the CHG Soap.            9.  Pat yourself dry with a clean towel.            10.  Wear clean pajamas.            11.  Place clean sheets on your bed the night of your first shower and do not  sleep with pets.  ON THE DAY OF SURGERY : Do not apply any lotions/deodorants the morning of surgery.  Please wear clean clothes to the hospital/surgery center.    FAILURE TO FOLLOW THESE INSTRUCTIONS MAY RESULT IN THE CANCELLATION OF YOUR SURGERY  PATIENT SIGNATURE_________________________________  NURSE SIGNATURE__________________________________  ________________________________________________________________________

## 2023-11-25 ENCOUNTER — Encounter (HOSPITAL_COMMUNITY): Payer: Self-pay

## 2023-11-25 ENCOUNTER — Encounter (HOSPITAL_COMMUNITY)
Admission: RE | Admit: 2023-11-25 | Discharge: 2023-11-25 | Disposition: A | Discharge status: Home / Self Care | Source: Ambulatory Visit | Attending: Urology | Admitting: Urology

## 2023-11-25 ENCOUNTER — Other Ambulatory Visit: Payer: Self-pay

## 2023-11-25 VITALS — BP 144/85 | HR 70 | Temp 97.7°F | Resp 18 | Ht 69.0 in | Wt 197.0 lb

## 2023-11-25 DIAGNOSIS — N201 Calculus of ureter: Secondary | ICD-10-CM | POA: Diagnosis not present

## 2023-11-25 DIAGNOSIS — I10 Essential (primary) hypertension: Secondary | ICD-10-CM | POA: Diagnosis not present

## 2023-11-25 DIAGNOSIS — Z01812 Encounter for preprocedural laboratory examination: Secondary | ICD-10-CM | POA: Diagnosis present

## 2023-11-25 DIAGNOSIS — Z01810 Encounter for preprocedural cardiovascular examination: Secondary | ICD-10-CM | POA: Diagnosis present

## 2023-11-25 DIAGNOSIS — Z01818 Encounter for other preprocedural examination: Secondary | ICD-10-CM | POA: Diagnosis not present

## 2023-11-25 HISTORY — DX: Sleep apnea, unspecified: G47.30

## 2023-11-25 HISTORY — DX: Gastro-esophageal reflux disease without esophagitis: K21.9

## 2023-11-25 HISTORY — DX: Essential (primary) hypertension: I10

## 2023-11-25 LAB — CBC
HCT: 44.7 % (ref 39.0–52.0)
Hemoglobin: 15.2 g/dL (ref 13.0–17.0)
MCH: 30.8 pg (ref 26.0–34.0)
MCHC: 34 g/dL (ref 30.0–36.0)
MCV: 90.7 fL (ref 80.0–100.0)
Platelets: 176 K/uL (ref 150–400)
RBC: 4.93 MIL/uL (ref 4.22–5.81)
RDW: 12.9 % (ref 11.5–15.5)
WBC: 7.1 K/uL (ref 4.0–10.5)
nRBC: 0 % (ref 0.0–0.2)

## 2023-11-25 LAB — BASIC METABOLIC PANEL WITH GFR
Anion gap: 13 (ref 5–15)
BUN: 15 mg/dL (ref 6–20)
CO2: 22 mmol/L (ref 22–32)
Calcium: 9.7 mg/dL (ref 8.9–10.3)
Chloride: 103 mmol/L (ref 98–111)
Creatinine, Ser: 1.08 mg/dL (ref 0.61–1.24)
GFR, Estimated: >60 mL/min (ref >60)
Glucose, Bld: 98 mg/dL (ref 70–99)
Potassium: 3.6 mmol/L (ref 3.5–5.1)
Sodium: 139 mmol/L (ref 135–145)

## 2023-12-01 NOTE — Anesthesia Preprocedure Evaluation (Signed)
 Anesthesia Evaluation  Patient identified by MRN, date of birth, ID band Patient awake    Reviewed: Allergy & Precautions, NPO status , Patient's Chart, lab work & pertinent test results  History of Anesthesia Complications Negative for: history of anesthetic complications  Airway Mallampati: I  TM Distance: >3 FB Neck ROM: Full    Dental  (+) Dental Advisory Given   Pulmonary sleep apnea (mild, does not require CPAP) , former smoker   breath sounds clear to auscultation       Cardiovascular hypertension (no meds),  Rhythm:Regular Rate:Normal     Neuro/Psych    GI/Hepatic Neg liver ROS,GERD  Controlled,,  Endo/Other  BMI 29  Renal/GU Renal InsufficiencyRenal diseasestones     Musculoskeletal   Abdominal   Peds  Hematology   Anesthesia Other Findings   Reproductive/Obstetrics                              Anesthesia Physical Anesthesia Plan  ASA: 2  Anesthesia Plan: General   Post-op Pain Management: Tylenol  PO (pre-op)*   Induction: Intravenous  PONV Risk Score and Plan: 2 and Ondansetron  and Dexamethasone   Airway Management Planned: LMA  Additional Equipment: None  Intra-op Plan:   Post-operative Plan:   Informed Consent: I have reviewed the patients History and Physical, chart, labs and discussed the procedure including the risks, benefits and alternatives for the proposed anesthesia with the patient or authorized representative who has indicated his/her understanding and acceptance.     Dental advisory given  Plan Discussed with: CRNA and Surgeon  Anesthesia Plan Comments:          Anesthesia Quick Evaluation

## 2023-12-02 ENCOUNTER — Ambulatory Visit (HOSPITAL_COMMUNITY): Admitting: Anesthesiology

## 2023-12-02 ENCOUNTER — Encounter (HOSPITAL_COMMUNITY): Payer: Self-pay | Admitting: Urology

## 2023-12-02 ENCOUNTER — Ambulatory Visit (HOSPITAL_COMMUNITY)

## 2023-12-02 ENCOUNTER — Ambulatory Visit (HOSPITAL_COMMUNITY)
Admission: RE | Admit: 2023-12-02 | Discharge: 2023-12-02 | Disposition: A | Discharge status: Home / Self Care | Source: Ambulatory Visit | Attending: Urology | Admitting: Urology

## 2023-12-02 ENCOUNTER — Ambulatory Visit (HOSPITAL_BASED_OUTPATIENT_CLINIC_OR_DEPARTMENT_OTHER): Admitting: Anesthesiology

## 2023-12-02 ENCOUNTER — Encounter (HOSPITAL_COMMUNITY)
Admission: RE | Disposition: A | Discharge status: Home / Self Care | Payer: Self-pay | Source: Ambulatory Visit | Attending: Urology

## 2023-12-02 DIAGNOSIS — Z87440 Personal history of urinary (tract) infections: Secondary | ICD-10-CM | POA: Insufficient documentation

## 2023-12-02 DIAGNOSIS — N200 Calculus of kidney: Secondary | ICD-10-CM

## 2023-12-02 DIAGNOSIS — K219 Gastro-esophageal reflux disease without esophagitis: Secondary | ICD-10-CM | POA: Diagnosis not present

## 2023-12-02 DIAGNOSIS — I10 Essential (primary) hypertension: Secondary | ICD-10-CM | POA: Insufficient documentation

## 2023-12-02 DIAGNOSIS — Z87891 Personal history of nicotine dependence: Secondary | ICD-10-CM | POA: Insufficient documentation

## 2023-12-02 DIAGNOSIS — G4730 Sleep apnea, unspecified: Secondary | ICD-10-CM | POA: Diagnosis not present

## 2023-12-02 DIAGNOSIS — N201 Calculus of ureter: Secondary | ICD-10-CM

## 2023-12-02 DIAGNOSIS — Z87442 Personal history of urinary calculi: Secondary | ICD-10-CM | POA: Diagnosis not present

## 2023-12-02 HISTORY — PX: CYSTOSCOPY W/ RETROGRADES: SHX1426

## 2023-12-02 HISTORY — PX: CYSTOSCOPY/URETEROSCOPY/HOLMIUM LASER/STENT PLACEMENT: SHX6546

## 2023-12-02 SURGERY — CYSTOSCOPY/URETEROSCOPY/HOLMIUM LASER/STENT PLACEMENT
Anesthesia: General | Site: Ureter | Laterality: Left

## 2023-12-02 MED ORDER — OXYCODONE HCL 5 MG PO TABS
ORAL_TABLET | ORAL | Status: AC
  Filled 2023-12-02: qty 1

## 2023-12-02 MED ORDER — IOHEXOL 300 MG/ML  SOLN
INTRAMUSCULAR | Status: DC | PRN
  Administered 2023-12-02: 6 mL

## 2023-12-02 MED ORDER — CEFAZOLIN SODIUM-DEXTROSE 2-4 GM/100ML-% IV SOLN
2.0000 g | INTRAVENOUS | Status: AC
Start: 2023-12-02 — End: 2023-12-02
  Administered 2023-12-02: 2 g via INTRAVENOUS
  Filled 2023-12-02: qty 100

## 2023-12-02 MED ORDER — MEPERIDINE HCL 100 MG/ML IJ SOLN: 6.2500 mg | INTRAMUSCULAR | Status: DC | PRN

## 2023-12-02 MED ORDER — MIDAZOLAM HCL 2 MG/2ML IJ SOLN: 0.5000 mg | Freq: Once | INTRAMUSCULAR | Status: DC | PRN

## 2023-12-02 MED ORDER — OXYCODONE HCL 5 MG PO TABS
5.0000 mg | ORAL_TABLET | Freq: Once | ORAL | Status: AC | PRN
  Administered 2023-12-02: 5 mg via ORAL

## 2023-12-02 MED ORDER — LIDOCAINE HCL (PF) 2 % IJ SOLN
INTRAMUSCULAR | Status: DC | PRN
  Administered 2023-12-02: 20 mg via INTRADERMAL

## 2023-12-02 MED ORDER — MIDAZOLAM HCL 2 MG/2ML IJ SOLN
INTRAMUSCULAR | Status: AC
  Filled 2023-12-02: qty 2

## 2023-12-02 MED ORDER — PROPOFOL 10 MG/ML IV BOLUS
INTRAVENOUS | Status: AC
  Filled 2023-12-02: qty 20

## 2023-12-02 MED ORDER — CHLORHEXIDINE GLUCONATE 0.12 % MT SOLN
15.0000 mL | Freq: Once | OROMUCOSAL | Status: AC
  Administered 2023-12-02: 15 mL via OROMUCOSAL

## 2023-12-02 MED ORDER — FENTANYL CITRATE PF 50 MCG/ML IJ SOSY: 25.0000 ug | PREFILLED_SYRINGE | INTRAMUSCULAR | Status: DC | PRN

## 2023-12-02 MED ORDER — DEXAMETHASONE SODIUM PHOSPHATE 10 MG/ML IJ SOLN
INTRAMUSCULAR | Status: AC
  Filled 2023-12-02: qty 1

## 2023-12-02 MED ORDER — OXYCODONE HCL 5 MG/5ML PO SOLN: 5.0000 mg | Freq: Once | ORAL | Status: AC | PRN

## 2023-12-02 MED ORDER — PROPOFOL 10 MG/ML IV BOLUS
INTRAVENOUS | Status: DC | PRN
  Administered 2023-12-02: 200 mg via INTRAVENOUS

## 2023-12-02 MED ORDER — LACTATED RINGERS IV SOLN
INTRAVENOUS | Status: DC
Start: 2023-12-02 — End: 2023-12-02

## 2023-12-02 MED ORDER — OXYCODONE-ACETAMINOPHEN 5-325 MG PO TABS: 1.0000 | ORAL_TABLET | ORAL | 0 refills | Status: DC | PRN

## 2023-12-02 MED ORDER — FENTANYL CITRATE (PF) 100 MCG/2ML IJ SOLN
INTRAMUSCULAR | Status: AC
  Filled 2023-12-02: qty 2

## 2023-12-02 MED ORDER — ACETAMINOPHEN 500 MG PO TABS
1000.0000 mg | ORAL_TABLET | Freq: Once | ORAL | Status: AC
  Administered 2023-12-02: 1000 mg via ORAL
  Filled 2023-12-02: qty 2

## 2023-12-02 MED ORDER — ONDANSETRON HCL 4 MG/2ML IJ SOLN
INTRAMUSCULAR | Status: DC | PRN
  Administered 2023-12-02: 4 mg via INTRAVENOUS

## 2023-12-02 MED ORDER — DEXAMETHASONE SODIUM PHOSPHATE 10 MG/ML IJ SOLN
INTRAMUSCULAR | Status: DC | PRN
Start: 2023-12-02 — End: 2023-12-02
  Administered 2023-12-02: 10 mg via INTRAVENOUS

## 2023-12-02 MED ORDER — SODIUM CHLORIDE 0.9 % IR SOLN
Status: DC | PRN
  Administered 2023-12-02: 3000 mL via INTRAVESICAL

## 2023-12-02 MED ORDER — DOCUSATE SODIUM 100 MG PO CAPS: 100.0000 mg | ORAL_CAPSULE | Freq: Every day | ORAL | 0 refills | Status: AC | PRN

## 2023-12-02 MED ORDER — FENTANYL CITRATE (PF) 100 MCG/2ML IJ SOLN
INTRAMUSCULAR | Status: DC | PRN
  Administered 2023-12-02: 100 ug via INTRAVENOUS

## 2023-12-02 MED ORDER — LIDOCAINE HCL (PF) 2 % IJ SOLN
INTRAMUSCULAR | Status: AC
  Filled 2023-12-02: qty 5

## 2023-12-02 MED ORDER — MIDAZOLAM HCL 2 MG/2ML IJ SOLN
INTRAMUSCULAR | Status: DC | PRN
Start: 2023-12-02 — End: 2023-12-02
  Administered 2023-12-02: 2 mg via INTRAVENOUS

## 2023-12-02 MED ORDER — ONDANSETRON HCL 4 MG/2ML IJ SOLN
INTRAMUSCULAR | Status: AC
  Filled 2023-12-02: qty 2

## 2023-12-02 MED ORDER — ORAL CARE MOUTH RINSE: 15.0000 mL | Freq: Once | OROMUCOSAL | Status: AC

## 2023-12-02 SURGICAL SUPPLY — 24 items
BAG URO CATCHER STRL LF (MISCELLANEOUS) ×1 IMPLANT
BASKET ZERO TIP NITINOL 2.4FR (BASKET) IMPLANT
BENZOIN TINCTURE PRP APPL 2/3 (GAUZE/BANDAGES/DRESSINGS) IMPLANT
CATH URETERAL DUAL LUMEN 10F (MISCELLANEOUS) IMPLANT
CATH URETL OPEN 5X70 (CATHETERS) ×1 IMPLANT
CLOTH BEACON ORANGE TIMEOUT ST (SAFETY) ×1 IMPLANT
DRSG TEGADERM 2-3/8X2-3/4 SM (GAUZE/BANDAGES/DRESSINGS) IMPLANT
FIBER LASER MOSES 200 DFL (Laser) IMPLANT
GLOVE BIOGEL M 7.0 STRL (GLOVE) ×1 IMPLANT
GOWN STRL REUS W/ TWL XL LVL3 (GOWN DISPOSABLE) ×1 IMPLANT
GUIDEWIRE STR DUAL SENSOR (WIRE) ×2 IMPLANT
GUIDEWIRE ZIPWRE .038 STRAIGHT (WIRE) IMPLANT
KIT TURNOVER KIT A (KITS) ×1 IMPLANT
MANIFOLD NEPTUNE II (INSTRUMENTS) ×1 IMPLANT
NS IRRIG 1000ML POUR BTL (IV SOLUTION) IMPLANT
PACK CYSTO (CUSTOM PROCEDURE TRAY) ×1 IMPLANT
PAD PREP 24X48 CUFFED NSTRL (MISCELLANEOUS) ×1 IMPLANT
SHEATH DILATOR SET 8/10 (MISCELLANEOUS) IMPLANT
SHEATH NAVIGATOR HD 11/13X28 (SHEATH) IMPLANT
SHEATH NAVIGATOR HD 11/13X36 (SHEATH) IMPLANT
STENT URET 6FRX26 CONTOUR (STENTS) IMPLANT
TRACTIP FLEXIVA PULS ID 200XHI (Laser) IMPLANT
TUBING CONNECTING 10 (TUBING) ×1 IMPLANT
TUBING UROLOGY SET (TUBING) ×1 IMPLANT

## 2023-12-02 NOTE — Op Note (Signed)
 Operative Note  Preoperative diagnosis:  1.  Left renal stones  Postoperative diagnosis: 1.  Left renal stones  Procedure(s): 1.  Cystoscopy 2. Left ureteroscopy with laser lithotripsy and basket extraction of stones 3. Left retrograde pyelogram 4. Left ureteral stent placement 5. Fluoroscopy with intraoperative interpretation  Surgeon: Donnice Siad, MD  Assistants:  None  Anesthesia:  General  Complications:  None  EBL:  Minimal  Specimens: 1. None  Drains/Catheters: 1.  6Fr x 26cm ureteral stent without a tether string  Intraoperative findings:   Cystoscopy demonstrated no significant bladder lesions. Left retrograde pyelogram demonstrated no hydronephrosis, no extravasation of contrast.  There was a filling defect in the left lower pole consistent with a large left lower pole stones. Left ureteroscopy demonstrated an area of tight stenosis in the distal left ureter that was not passable with a dual-lumen scope however after dilation,  passed with single lumen digital ureteroscope.  There were 2 separate stones in the left lower pole largest measuring about 1.5 cm in craniocaudad length.  The stones were dusted in their entirety. Successful stent placement.  Indication:  Danny Mendez is a 58 y.o. male with a history of urolithiasis including most recently, large left lower pole stone burden feeling the calyx and diverticulum about 1.5 cm in craniocaudad length.  After considering options, he would like to proceed with staged ureteroscopy given large stone burden.  Description of procedure: After informed consent was obtained from the patient, the patient was identified and taken to the operating room and placed in the supine position.  General anesthesia was administered as well as perioperative IV antibiotics.  At the beginning of the case, a time-out was performed to properly identify the patient, the surgery to be performed, and the surgical site.  Sequential compression  devices were applied to the lower extremities at the beginning of the case for DVT prophylaxis.  The patient was then placed in the dorsal lithotomy supine position, prepped and draped in sterile fashion.  On exam, he did have a couple lesions 1 lesion was on his suprapubic area about 3 x 3 cm that appeared to be consistent with skin wart.  There is a separate skin tag on the right proximal thigh.  Preliminary scout fluoroscopy revealed that there was an excellent 1.5 cm calcification area at the left lower pole, which corresponds to the stone found on the preoperative CT scan. We then passed the 21-French rigid cystoscope through the urethra and into the bladder under vision without any difficulty, noting a normal urethra without strictures and a mildly obstructing prostate.  A systematic evaluation of the bladder revealed no evidence of any suspicious bladder lesions.  Ureteral orifices were in normal position.    Under cystoscopic and flouroscopic guidance, we cannulated the left ureteral orifice with a 5-French open-ended ureteral catheter and a gentle retrograde pyelogram was performed, revealing a normal caliber ureter without any filling defects. There was no hydronephrosis of the collecting system. There was a 1.5 cm filling defect in the left lower pole corresponding to the large left lower pole stones. A 0.038 sensor wire was then passed up to the level of the renal pelvis and secured to the drape as a safety wire. The ureteral catheter and cystoscope were removed, leaving the safety wire in place.   A semi-rigid ureteroscope was passed alongside the wire up the distal ureter which appeared that there was 1 area in the distal left ureter there was stenotic. Second 0.038 sensor wire was passed  under direct vision and the semirigid scope was removed.  I then attempted to pass a dual-lumen digital scope however this would not budge.  On the distal left ureter.  I then inserted a dual-lumen catheter and  dilated this area.  I repeated attempt at dual-lumen scope passage however this would not pass beyond the distal left ureter.  I then proceeded to pass a single-lumen digital ureteroscope and after some difficulty, this eventually bypassed the area of stenosis in the distal left ureter.  This was then was passed into the kidney.  Kidney was surveilled and there appeared to be a large stone burden in the left lower pole filling the entire calyx and diverticula that appeared to be about 1.5 cm in craniocaudad length.  Using the 272 micron holmium laser fiber, the stone was dusted and fragmented completely.  There is significant amount of dust and small fragments.  Given that his ureter was tight, elected not to proceed with passing access sheath or basketing to avoid trauma to his ureter.  Thus, he would be stented and plan for already scheduled second stage procedure.  We then withdrew the ureteroscope back down the ureter noting no evidence of any stones along the course of the ureter.  Prior to removing the ureteroscope, we did pass the Glidewire back up to the ureter to the renal pelvis.    Once the ureteroscope was removed, the Glidewire was backloaded through the rigid cystoscope, which was then advanced down the urethra and into the bladder. We then used the Glidewire under direct vision through the rigid cystoscope and under fluoroscopic guidance and passed up a 6-French, 26 cm double-pigtail ureteral stent up ureter, making sure that the proximal and distal ends coiled within the kidney and bladder respectively.  I did not leave a tether string.  We were able to see the distal stent coiling nicely within the bladder.  The bladder was then emptied with irrigation solution.  The cystoscope was then removed.    The patient tolerated the procedure well and there was no complication. Patient was awoken from anesthesia and taken to the recovery room in stable condition. I was present and scrubbed for the  entirety of the case.  Plan:  Patient will be discharged home.  We will plan his scheduled second look ureteroscopy to remove his stones.    [name] MD [facility]  Pager: [phone]

## 2023-12-02 NOTE — Discharge Instructions (Signed)
 [facility] [phone] Post Ureteroscopy With or Without Stent Instructions  Definitions:  Ureter: The duct that transports urine from the kidney to the bladder. Stent:   A plastic hollow tube that is placed into the ureter, from the kidney to the bladder to prevent the ureter from swelling shut.  GENERAL INSTRUCTIONS:  Despite the fact that no skin incisions were used, the area around the ureter and bladder is raw and irritated. The stent is a foreign body which will further irritate the bladder wall. This irritation is manifested by increased frequency of urination, both day and night, and by an increase in the urge to urinate. In some, the urge to urinate is present almost always. Sometimes the urge is strong enough that you may not be able to stop yourself from urinating. The only real cure is to remove the stent and then give time for the bladder wall to heal which can't be done until the danger of the ureter swelling shut has passed, which varies.  You may see some blood in your urine while the stent is in place and a few days afterwards. Do not be alarmed, even if the urine was clear for a while. Get off your feet and drink lots of fluids until clearing occurs. If you start to pass clots or don't improve, call us .  DIET: You may return to your normal diet immediately. Because of the raw surface of your bladder, alcohol, spicy foods, acid type foods and drinks with caffeine may cause irritation or frequency and should be used in moderation. To keep your urine flowing freely and to avoid constipation, drink plenty of fluids during the day ( 8-10 glasses ). Tip: Avoid cranberry juice because it is very acidic.  ACTIVITY: Your physical activity doesn't need to be restricted. However, if you are very active, you may see some blood in your urine. We suggest that you reduce your activity under these circumstances until the bleeding has stopped.  BOWELS: It is important to  keep your bowels regular during the postoperative period. Straining with bowel movements can cause bleeding. A bowel movement every other day is reasonable. Use a mild laxative if needed, such as Milk of Magnesia 2-3 tablespoons, or 2 Dulcolax tablets. Call if you continue to have problems. If you have been taking narcotics for pain, before, during or after your surgery, you may be constipated. Take a laxative if necessary.   MEDICATION: You should resume your pre-surgery medications unless told not to. In addition you will often be given an antibiotic to prevent infection. These should be taken as prescribed until the bottles are finished unless you are having an unusual reaction to one of the drugs.  PROBLEMS YOU SHOULD REPORT TO US : Fevers over 100.5 Fahrenheit. Heavy bleeding, or clots ( See above notes about blood in urine ). Inability to urinate. Drug reactions ( hives, rash, nausea, vomiting, diarrhea ). Severe burning or pain with urination that is not improving.  FOLLOW-UP: You will need a follow-up appointment to monitor your progress. Call for this appointment at the number listed above. Usually the first appointment will be about three to fourteen days after your surgery.  You have a stent draining your left kidney. This will be removed during next surgery.

## 2023-12-02 NOTE — H&P (Signed)
 Office Visit Report     11/22/2023   --------------------------------------------------------------------------------   Danny Mendez  MRN: [id number]  DOB: Apr 22, 1965, 58 year old Male  SSN: [id number]   PRIMARY CARE:     REFERRING:  Carlin Griffon, PA  PROVIDER:  Donnice Siad, M.D.  TREATING:  Alan Clois Hammonds, [place]  LOCATION:  [facility] [phone]     --------------------------------------------------------------------------------   CC/HPI: Pt presents today for pre-operative history and physical exam in anticipation of first stage cysto, left ureteroscopy/laser litho/retrograde and stent placement by Dr. Siad on 12/02/23. He is doing well and is without complaint.   Pt denies F/C, HA, CP, SOB, N/V, diarrhea/constipation, back pain, flank pain, hematuria, and dysuria.     HX:    Danny Mendez is a 58 year old male who is seen in followup today for history of urolithiasis.   #1. Urolithiasis:  Danny Mendez: METx3 over a decade ago. History of ureteral stone with provide UTI s/p stent with staged ureteroscopy in 2022.  -CT A/P 10/2021 with nonobstructing bilateral renal stones with 2 stones in the left lower pole measuring 7 mm and 3 mm right interpolar renal stone.  -He developed acute onset right-sided flank pain and presented to urgent care on 09/05/2022. CT A/P demonstrated 4 mm proximal right renal stone with mild right hydronephrosis as well as 8 mm left lower pole stone and adjacent smaller stone.  - He was able to pass a ureteral stone in 6/24.  - He has had intermittent left flank pain. He denies fevers, chills, dysuria.   Last PSA was a couple years ago which was 1.5. He states he is following up with PCP later this year for PSA.   He is from [place].     ALLERGIES:  Niacin - passed out    MEDICATIONS: Rosuvastatin Calcium 10 MG Tablet     GU PSH: Cystoscopy Insert Stent, Left - 2021 Ureteroscopic laser litho, Left - 2022       PSH Notes: Encounter  for contraceptive planning, No Surgical Problems     NON-GU PSH: Visit Complexity (formerly GPC1X) - 10/06/2023     GU PMH: Renal calculus - 10/13/2023, - 10/06/2023, - 10/11/2022, - 09/08/2022, - 2023, A bit difficult to tell today, but he has at least 1, perhaps 2 left lower pole calculi. They do not seem to be larger than CT scan from late 2021. Perhaps a stable right renal stone 3 mm in size., - 2023, Reviewed patient's 24 hour urine with him and recommended limiting sodium and calcium. He was given a printout of these results. In addition he has residual 9 mm left renal calculus in 3 mm right renal calculus. He is interested in proceeding with ESWL. Risks and benefits of the procedure discussed including but not limited to bleeding, infection, failure to break up stone, hematoma, postprocedure HTN, infection, need for future procedure such as stent/URS. Patient will call to schedule when he is ready. , - 2022, Reviewed KUB and renal ultrasound findings with patient. Have recommended continued stone prevention strategies including drinking 2-3 L of water  daily and limiting animal protein and salt. In addition will proceed with 24 hour urine through with a link. We did discuss scheduling ESWL for the left renal calculus have her patient is also had foot surgery recently would like to hold off on any further surgery at the time. , - 2022, Discussed patient's CT scan which show an 8 mm left UPJ calculus with a 9 mm nonobstructing left renal  calculus as well as a 3 mm right nonobstructing renal calculus. We discussed management options for stones including ESWL versus ureteroscopy with laser lithotripsy. Patient would like to have 1 procedure and keeping this in mind we decided to proceed with left ureteroscopy with laser lithotripsy and stent exchange. Due to his foot surgery will postpone this procedure approximately 4 weeks for now. I will refill his pain medication and given oxybutynin  to see if this helps  with bladder urgency and suprapubic pressure. We discussed the risks and benefits of procedure including but not limited to bleeding, pain, infection, stent pain, damage to surrounding structures, need for future treatment. Patient was encouraged to drink 2-3 L of water  a day., - 2021, - 2021, Kidney stone on left side, - 2014 History of urolithiasis - 10/11/2022, stable asymptomatic stones, - 2023, Nephrolithiasis, - 2014 Ureteral calculus - 10/11/2022, - 2022, - 2021, - 2021, Distal Ureteral Stone On The Left, - 2014 Hydronephrosis - 09/08/2022 Flank Pain - 2022      PMH Notes:  1898-03-22 00:00:00 - Note: Normal Routine History And Physical Adult   HLD     NON-GU PMH: None   FAMILY HISTORY: Death In The Family Father - Father Diabetes - Mother Family Health Status Number - Father nephrolithiasis - Father, Mother   SOCIAL HISTORY: Marital Status: Legally Separated Current Smoking Status: Patient does not smoke anymore. Smoked 1 pack per day.  Does not use smokeless tobacco. Has never drank.  Does not use drugs. Drinks 1 caffeinated drink per day. Has not had a blood transfusion.     Notes: r   REVIEW OF SYSTEMS:    GU Review Male:   Patient reports get up at night to urinate. Patient denies frequent urination, hard to postpone urination, burning/ pain with urination, leakage of urine, stream starts and stops, trouble starting your stream, have to strain to urinate , erection problems, and penile pain.  Gastrointestinal (Upper):   Patient denies nausea, vomiting, and indigestion/ heartburn.  Gastrointestinal (Lower):   Patient denies diarrhea and constipation.  Constitutional:   Patient denies fever, night sweats, weight loss, and fatigue.  Skin:   Patient denies skin rash/ lesion and itching.  Eyes:   Patient denies blurred vision and double vision.  Ears/ Nose/ Throat:   Patient denies sore throat and sinus problems.  Hematologic/Lymphatic:   Patient denies easy bruising and  swollen glands.  Cardiovascular:   Patient denies leg swelling and chest pains.  Respiratory:   Patient denies cough and shortness of breath.  Endocrine:   Patient denies excessive thirst.  Musculoskeletal:   Patient denies back pain and joint pain.  Neurological:   Patient denies headaches and dizziness.  Psychologic:   Patient denies depression and anxiety.   VITAL SIGNS:      11/22/2023 03:35 PM  BP 150/95 mmHg  Pulse 60 /min  Temperature 98.4 F / 36.8 C   MULTI-SYSTEM PHYSICAL EXAMINATION:    Constitutional: Well-nourished. No physical deformities. Normally developed. Good grooming.  Neck: Neck symmetrical, not swollen. Normal tracheal position.  Respiratory: Normal breath sounds. No labored breathing, no use of accessory muscles.   Cardiovascular: Regular rate and rhythm. No murmur, no gallop  Lymphatic: No enlargement of neck, axillae, groin.  Skin: No paleness, no jaundice, no cyanosis. No lesion, no ulcer, no rash.  Neurologic / Psychiatric: Oriented to time, oriented to place, oriented to person. No depression, no anxiety, no agitation.  Gastrointestinal: No mass, no tenderness, no rigidity, non obese abdomen.  Eyes: Normal conjunctivae. Normal eyelids.  Ears, Nose, Mouth, and Throat: Left ear no scars, no lesions, no masses. Right ear no scars, no lesions, no masses. Nose no scars, no lesions, no masses. Normal hearing. Normal lips.  Musculoskeletal: Normal gait and station of head and neck.     Complexity of Data:  Records Review:   Previous Patient Records  Urine Test Review:   Urinalysis   11/22/23  Urinalysis  Urine Appearance Clear   Urine Color Yellow   Urine Glucose Neg mg/dL  Urine Bilirubin Neg mg/dL  Urine Ketones Neg mg/dL  Urine Specific Gravity 1.010   Urine Blood Neg ery/uL  Urine pH 6.5   Urine Protein Trace mg/dL  Urine Urobilinogen 0.2 mg/dL  Urine Nitrites Neg   Urine Leukocyte Esterase Neg leu/uL   PROCEDURES:          Urinalysis -  81003 Dipstick Dipstick Cont'd  Color: Yellow Bilirubin: Neg mg/dL  Appearance: Clear Ketones: Neg mg/dL  Specific Gravity: 8.989 Blood: Neg ery/uL  pH: 6.5 Protein: Trace mg/dL  Glucose: Neg mg/dL Urobilinogen: 0.2 mg/dL    Nitrites: Neg    Leukocyte Esterase: Neg leu/uL    ASSESSMENT:      ICD-10 Details  1 GU:   Renal calculus - N20.0    PLAN:           Schedule Return Visit/Planned Activity: Keep Scheduled Appointment - Schedule Surgery          Document Letter(s):  Created for Patient: Clinical Summary         Notes:   There are no changes in the patients history or physical exam since last evaluation by Dr. Selma. Pt is scheduled to undergo cysto, left ureteroscopy/laser litho/retrograde and stent on 12/02/23.   All pt's questions were answered to the best of my ability.    Urology Preoperative H&P   Chief Complaint: Left renal stones  History of Present Illness: Danny Mendez is a 58 y.o. male with left renal stones here for first stage cysto, L RPG, L URS/LL, L stent. Denies fevers, chills, dysuria.    Past Medical History:  Diagnosis Date   Cast in place on lower extremity 03/07/2020   using crutches   GERD (gastroesophageal reflux disease)    History of kidney stones    Hypertension    no medications   Renal disorder    Sleep apnea    no CPAP,  mild OSA   Wears glasses     Past Surgical History:  Procedure Laterality Date   CYSTOSCOPY WITH RETROGRADE PYELOGRAM, URETEROSCOPY AND STENT PLACEMENT Left 02/21/2020   Procedure: CYSTOSCOPY WITH RETROGRADE PYELOGRAM AND STENT PLACEMENT;  Surgeon: Rosalind Zachary NOVAK, MD;  Location: [facility];  Service: Urology;  Laterality: Left;   CYSTOSCOPY/URETEROSCOPY/HOLMIUM LASER/STENT PLACEMENT Left 03/25/2020   Procedure: CYSTOSCOPY LEFT URETEROSCOPY/HOLMIUM LASER/STENT EXCHANGE STONE BASKET EXTRACTION ;  Surgeon: Elisabeth Valli BIRCH, MD;  Location: [facility];  Service: Urology;  Laterality: Left;  REQUESTING 90  MINS   FOOT SURGERY Left 03/07/2020   torn tendons, and bone spur removal   TONSILLECTOMY  as child    Allergies:  Allergies  Allergen Reactions   Niacin And Related Other (See Comments)    Pt. Stated, makes me pass out.    History reviewed. No pertinent family history.  Social History:  reports that he quit smoking about 17 years ago. His smoking use included cigarettes. He started smoking about 32 years ago. He has a 15 pack-year  smoking history. He has never used smokeless tobacco. He reports that he does not drink alcohol and does not use drugs.  ROS: A complete review of systems was performed.  All systems are negative except for pertinent findings as noted.  Physical Exam:  Vital signs in last 24 hours: Temp:  [98 F (36.7 C)] 98 F (36.7 C) (09/12 0543) Pulse Rate:  [75] 75 (09/12 0543) Resp:  [18] 18 (09/12 0543) BP: (139)/(89) 139/89 (09/12 0543) SpO2:  [98 %] 98 % (09/12 0543) Weight:  [89.4 kg] 89.4 kg (09/12 9386) Constitutional:  Alert and oriented, No acute distress Cardiovascular: Regular rate and rhythm Respiratory: Normal respiratory effort, Lungs clear bilaterally GI: Abdomen is soft, nontender, nondistended, no abdominal masses GU: No CVA tenderness Lymphatic: No lymphadenopathy Neurologic: Grossly intact, no focal deficits Psychiatric: Normal mood and affect  Laboratory Data:  No results for input(s): WBC, HGB, HCT, PLT in the last 72 hours.  No results for input(s): NA, K, CL, GLUCOSE, BUN, CALCIUM, CREATININE in the last 72 hours.  Invalid input(s): CO3   No results found for this or any previous visit (from the past 24 hours). No results found for this or any previous visit (from the past 240 hours).  Renal Function: Recent Labs    11/25/23 1406  CREATININE 1.08   Estimated Creatinine Clearance: 84.5 mL/min (by C-G formula based on SCr of 1.08 mg/dL).  Radiologic Imaging: No results found.  I independently  reviewed the above imaging studies.  Assessment and Plan Maddon Horton is a 58 y.o. male with left renal stones here for first stage cysto, L RPG, L URS/LL, L stent. Denies fevers, chills, dysuria.  -The risks, benefits and alternatives of cystoscopy with L URS, L RPG, L JJ stent placement was discussed with the patient.  Risks include, but are not limited to: bleeding, urinary tract infection, ureteral injury, ureteral stricture disease, chronic pain, urinary symptoms, bladder injury, stent migration, the need for nephrostomy tube placement, MI, CVA, DVT, PE and the inherent risks with general anesthesia.  The patient voices understanding and wishes to proceed.   Matt R. Hyun Marsalis MD 12/02/2023, 6:44 AM  [facility] Pager: [phone]): [phone]

## 2023-12-02 NOTE — Transfer of Care (Signed)
 Immediate Anesthesia Transfer of Care Note  Patient: Danny Mendez  Procedure(s) Performed: CYSTOSCOPY/URETEROSCOPY/HOLMIUM LASER/STENT PLACEMENT (Left: Ureter) CYSTOSCOPY, WITH RETROGRADE PYELOGRAM (Left: Ureter)  Patient Location: PACU  Anesthesia Type:General  Level of Consciousness: drowsy and patient cooperative  Airway & Oxygen Therapy: Patient Spontanous Breathing and Patient connected to face mask oxygen  Post-op Assessment: Report given to RN and Post -op Vital signs reviewed and stable  Post vital signs: Reviewed and stable  Last Vitals:  Vitals Value Taken Time  BP 120/83 12/02/23 08:43  Temp 36.5 C 12/02/23 08:43  Pulse 63 12/02/23 08:45  Resp 11 12/02/23 08:45  SpO2 99 % 12/02/23 08:45  Vitals shown include unfiled device data.  Last Pain:  Vitals:   12/02/23 0543  TempSrc: Oral         Complications: No notable events documented.

## 2023-12-02 NOTE — Anesthesia Postprocedure Evaluation (Signed)
 Anesthesia Post Note  Patient: Danny Mendez  Procedure(s) Performed: CYSTOSCOPY/URETEROSCOPY/HOLMIUM LASER/STENT PLACEMENT (Left: Ureter) CYSTOSCOPY, WITH RETROGRADE PYELOGRAM (Left: Ureter)     Patient location during evaluation: Phase II Anesthesia Type: General Level of consciousness: awake and alert, oriented and patient cooperative Pain management: pain level controlled Vital Signs Assessment: post-procedure vital signs reviewed and stable Respiratory status: spontaneous breathing, nonlabored ventilation and respiratory function stable Cardiovascular status: blood pressure returned to baseline and stable Postop Assessment: no apparent nausea or vomiting and able to ambulate Anesthetic complications: no   No notable events documented.  Last Vitals:  Vitals:   12/02/23 0915 12/02/23 0937  BP: (!) 133/93 (!) 143/88  Pulse: 62 (!) 57  Resp: 18   Temp:  (!) 36.3 C  SpO2: 96% 99%    Last Pain:  Vitals:   12/02/23 0937  TempSrc:   PainSc: 2                  Cannie Muckle,E. Lagretta Loseke

## 2023-12-02 NOTE — Anesthesia Procedure Notes (Signed)
 Procedure Name: LMA Insertion Date/Time: 12/02/2023 7:40 AM  Performed by: Para Jerelene CROME, CRNAPre-anesthesia Checklist: Patient identified, Suction available, Emergency Drugs available and Patient being monitored Patient Re-evaluated:Patient Re-evaluated prior to induction Oxygen Delivery Method: Circle system utilized Preoxygenation: Pre-oxygenation with 100% oxygen Induction Type: IV induction Ventilation: Mask ventilation without difficulty LMA: LMA inserted LMA Size: 4.0 Number of attempts: 1 Placement Confirmation: positive ETCO2, CO2 detector and breath sounds checked- equal and bilateral ETT to lip (cm): No markings on LMA . Tube secured with: Tape Dental Injury: Teeth and Oropharynx as per pre-operative assessment  Comments: Atraumatic insertion of LMA x 1. Lips and teeth remain in preoperative condition.

## 2023-12-03 ENCOUNTER — Encounter (HOSPITAL_COMMUNITY): Payer: Self-pay | Admitting: Urology

## 2023-12-08 NOTE — Progress Notes (Signed)
 Anesthesia Review:  PCP: Cardiologist :  PPM/ ICD: Device Orders: Rep Notified:  Chest x-ray : EKG : 11/25/23  Echo : Stress test: Cardiac Cath :   Activity level:  Sleep Study/ CPAP : Fasting Blood Sugar :      / Checks Blood Sugar -- times a day:    Blood Thinner/ Instructions /Last Dose: ASA / Instructions/ Last Dose :    Cysto-12/02/23    Called and LVMM on 11/21/23.  Asked for call back.

## 2023-12-14 ENCOUNTER — Encounter (HOSPITAL_COMMUNITY): Payer: Self-pay | Admitting: Urology

## 2023-12-14 ENCOUNTER — Other Ambulatory Visit: Payer: Self-pay

## 2023-12-14 NOTE — Progress Notes (Signed)
 Attempted to obtain medical history via telephone, unable to reach at this time. HIPAA compliant voicemail message left requesting return call to pre surgical testing department.

## 2023-12-15 NOTE — Anesthesia Preprocedure Evaluation (Signed)
 Anesthesia Evaluation  Patient identified by MRN, date of birth, ID band Patient awake    Reviewed: Allergy & Precautions, NPO status , Patient's Chart, lab work & pertinent test results  Airway Mallampati: III  TM Distance: >3 FB Neck ROM: Full    Dental  (+) Teeth Intact, Dental Advisory Given   Pulmonary sleep apnea (mild OSA, no cpap) , former smoker Quit smoking 2008, 15 pack year history   Pulmonary exam normal breath sounds clear to auscultation       Cardiovascular hypertension (131/88 preop, no home meds), Normal cardiovascular exam Rhythm:Regular Rate:Normal     Neuro/Psych negative neurological ROS  negative psych ROS   GI/Hepatic Neg liver ROS,GERD  Controlled,,  Endo/Other  negative endocrine ROS    Renal/GU negative Renal ROS  negative genitourinary   Musculoskeletal negative musculoskeletal ROS (+)    Abdominal   Peds  Hematology negative hematology ROS (+)   Anesthesia Other Findings   Reproductive/Obstetrics negative OB ROS                              Anesthesia Physical Anesthesia Plan  ASA: 2  Anesthesia Plan: General   Post-op Pain Management: Tylenol  PO (pre-op)* and Toradol  IV (intra-op)*   Induction: Intravenous  PONV Risk Score and Plan: 3 and Ondansetron , Dexamethasone , Midazolam  and Treatment may vary due to age or medical condition  Airway Management Planned: LMA  Additional Equipment: None  Intra-op Plan:   Post-operative Plan: Extubation in OR  Informed Consent: I have reviewed the patients History and Physical, chart, labs and discussed the procedure including the risks, benefits and alternatives for the proposed anesthesia with the patient or authorized representative who has indicated his/her understanding and acceptance.     Dental advisory given  Plan Discussed with: CRNA  Anesthesia Plan Comments:          Anesthesia Quick  Evaluation

## 2023-12-16 ENCOUNTER — Other Ambulatory Visit: Payer: Self-pay

## 2023-12-16 ENCOUNTER — Encounter (HOSPITAL_COMMUNITY): Admitting: Anesthesiology

## 2023-12-16 ENCOUNTER — Encounter (HOSPITAL_COMMUNITY): Payer: Self-pay | Admitting: Urology

## 2023-12-16 ENCOUNTER — Ambulatory Visit (HOSPITAL_BASED_OUTPATIENT_CLINIC_OR_DEPARTMENT_OTHER): Admitting: Anesthesiology

## 2023-12-16 ENCOUNTER — Ambulatory Visit (HOSPITAL_COMMUNITY)
Admission: RE | Admit: 2023-12-16 | Discharge: 2023-12-16 | Disposition: A | Discharge status: Home / Self Care | Attending: Urology | Admitting: Urology

## 2023-12-16 ENCOUNTER — Ambulatory Visit (HOSPITAL_COMMUNITY)

## 2023-12-16 ENCOUNTER — Encounter (HOSPITAL_COMMUNITY)
Admission: RE | Disposition: A | Discharge status: Home / Self Care | Payer: Self-pay | Source: Home / Self Care | Attending: Urology

## 2023-12-16 DIAGNOSIS — N201 Calculus of ureter: Secondary | ICD-10-CM

## 2023-12-16 DIAGNOSIS — Z87891 Personal history of nicotine dependence: Secondary | ICD-10-CM | POA: Insufficient documentation

## 2023-12-16 DIAGNOSIS — G4733 Obstructive sleep apnea (adult) (pediatric): Secondary | ICD-10-CM | POA: Insufficient documentation

## 2023-12-16 DIAGNOSIS — I10 Essential (primary) hypertension: Secondary | ICD-10-CM | POA: Diagnosis not present

## 2023-12-16 DIAGNOSIS — N200 Calculus of kidney: Secondary | ICD-10-CM | POA: Diagnosis present

## 2023-12-16 DIAGNOSIS — K219 Gastro-esophageal reflux disease without esophagitis: Secondary | ICD-10-CM | POA: Diagnosis not present

## 2023-12-16 HISTORY — PX: CYSTOSCOPY W/ RETROGRADES: SHX1426

## 2023-12-16 HISTORY — PX: CYSTOSCOPY/URETEROSCOPY/HOLMIUM LASER/STENT PLACEMENT: SHX6546

## 2023-12-16 SURGERY — CYSTOSCOPY/URETEROSCOPY/HOLMIUM LASER/STENT PLACEMENT
Anesthesia: General | Site: Ureter | Laterality: Left

## 2023-12-16 MED ORDER — FENTANYL CITRATE (PF) 100 MCG/2ML IJ SOLN
INTRAMUSCULAR | Status: DC | PRN
  Administered 2023-12-16: 100 ug via INTRAVENOUS

## 2023-12-16 MED ORDER — DEXAMETHASONE SODIUM PHOSPHATE 10 MG/ML IJ SOLN
INTRAMUSCULAR | Status: DC | PRN
  Administered 2023-12-16: 10 mg via INTRAVENOUS

## 2023-12-16 MED ORDER — LIDOCAINE HCL (PF) 2 % IJ SOLN
INTRAMUSCULAR | Status: DC | PRN
  Administered 2023-12-16: 100 mg via INTRADERMAL

## 2023-12-16 MED ORDER — KETOROLAC TROMETHAMINE 30 MG/ML IJ SOLN: 30.0000 mg | Freq: Once | INTRAMUSCULAR | Status: DC | PRN

## 2023-12-16 MED ORDER — LACTATED RINGERS IV SOLN: INTRAVENOUS | Status: DC

## 2023-12-16 MED ORDER — OXYCODONE-ACETAMINOPHEN 5-325 MG PO TABS: 1.0000 | ORAL_TABLET | ORAL | 0 refills | Status: AC | PRN

## 2023-12-16 MED ORDER — PROPOFOL 10 MG/ML IV BOLUS
INTRAVENOUS | Status: DC | PRN
  Administered 2023-12-16: 200 mg via INTRAVENOUS

## 2023-12-16 MED ORDER — AMISULPRIDE (ANTIEMETIC) 5 MG/2ML IV SOLN: 10.0000 mg | Freq: Once | INTRAVENOUS | Status: DC | PRN

## 2023-12-16 MED ORDER — IOHEXOL 300 MG/ML  SOLN
INTRAMUSCULAR | Status: DC | PRN
  Administered 2023-12-16: 8 mL

## 2023-12-16 MED ORDER — CHLORHEXIDINE GLUCONATE 0.12 % MT SOLN
15.0000 mL | Freq: Once | OROMUCOSAL | Status: AC
  Administered 2023-12-16: 15 mL via OROMUCOSAL

## 2023-12-16 MED ORDER — OXYCODONE HCL 5 MG/5ML PO SOLN: 5.0000 mg | Freq: Once | ORAL | Status: DC | PRN

## 2023-12-16 MED ORDER — MIDAZOLAM HCL 2 MG/2ML IJ SOLN
INTRAMUSCULAR | Status: AC
  Filled 2023-12-16: qty 2

## 2023-12-16 MED ORDER — PHENYLEPHRINE 80 MCG/ML (10ML) SYRINGE FOR IV PUSH (FOR BLOOD PRESSURE SUPPORT)
PREFILLED_SYRINGE | INTRAVENOUS | Status: AC
Start: 2023-12-16 — End: 2023-12-16
  Filled 2023-12-16: qty 10

## 2023-12-16 MED ORDER — SODIUM CHLORIDE 0.9 % IR SOLN
Status: DC | PRN
  Administered 2023-12-16: 3000 mL

## 2023-12-16 MED ORDER — HYDROMORPHONE HCL 1 MG/ML IJ SOLN: 0.2500 mg | INTRAMUSCULAR | Status: DC | PRN

## 2023-12-16 MED ORDER — MEPERIDINE HCL 100 MG/ML IJ SOLN: 6.2500 mg | INTRAMUSCULAR | Status: DC | PRN

## 2023-12-16 MED ORDER — CEFAZOLIN SODIUM-DEXTROSE 2-4 GM/100ML-% IV SOLN
2.0000 g | INTRAVENOUS | Status: AC
  Administered 2023-12-16: 2 g via INTRAVENOUS
  Filled 2023-12-16: qty 100

## 2023-12-16 MED ORDER — ORAL CARE MOUTH RINSE: 15.0000 mL | Freq: Once | OROMUCOSAL | Status: AC

## 2023-12-16 MED ORDER — MIDAZOLAM HCL 5 MG/5ML IJ SOLN
INTRAMUSCULAR | Status: DC | PRN
  Administered 2023-12-16: 2 mg via INTRAVENOUS

## 2023-12-16 MED ORDER — OXYCODONE HCL 5 MG PO TABS: 5.0000 mg | ORAL_TABLET | Freq: Once | ORAL | Status: DC | PRN

## 2023-12-16 MED ORDER — PROPOFOL 10 MG/ML IV BOLUS
INTRAVENOUS | Status: AC
Start: 2023-12-16 — End: 2023-12-16
  Filled 2023-12-16: qty 20

## 2023-12-16 MED ORDER — FENTANYL CITRATE (PF) 100 MCG/2ML IJ SOLN
INTRAMUSCULAR | Status: AC
  Filled 2023-12-16: qty 2

## 2023-12-16 MED ORDER — ACETAMINOPHEN 500 MG PO TABS
1000.0000 mg | ORAL_TABLET | Freq: Once | ORAL | Status: AC
  Administered 2023-12-16: 1000 mg via ORAL
  Filled 2023-12-16: qty 2

## 2023-12-16 MED ORDER — PHENYLEPHRINE HCL (PRESSORS) 10 MG/ML IV SOLN
INTRAVENOUS | Status: DC | PRN
  Administered 2023-12-16 (×3): 80 ug via INTRAVENOUS

## 2023-12-16 MED ORDER — ONDANSETRON HCL 4 MG/2ML IJ SOLN: 4.0000 mg | Freq: Once | INTRAMUSCULAR | Status: DC | PRN

## 2023-12-16 MED ORDER — ONDANSETRON HCL 4 MG/2ML IJ SOLN
INTRAMUSCULAR | Status: DC | PRN
  Administered 2023-12-16: 4 mg via INTRAVENOUS

## 2023-12-16 SURGICAL SUPPLY — 25 items
BAG URO CATCHER STRL LF (MISCELLANEOUS) ×1 IMPLANT
BASKET ZERO TIP NITINOL 2.4FR (BASKET) IMPLANT
BENZOIN TINCTURE PRP APPL 2/3 (GAUZE/BANDAGES/DRESSINGS) IMPLANT
CATH URETERAL DUAL LUMEN 10F (MISCELLANEOUS) IMPLANT
CATH URETL OPEN 5X70 (CATHETERS) ×1 IMPLANT
CLOTH BEACON ORANGE TIMEOUT ST (SAFETY) ×1 IMPLANT
DRSG TEGADERM 2-3/8X2-3/4 SM (GAUZE/BANDAGES/DRESSINGS) IMPLANT
FIBER LASER MOSES 200 DFL (Laser) IMPLANT
GLOVE BIOGEL M 7.0 STRL (GLOVE) ×1 IMPLANT
GOWN STRL REUS W/ TWL XL LVL3 (GOWN DISPOSABLE) ×1 IMPLANT
GUIDEWIRE STR DUAL SENSOR (WIRE) ×2 IMPLANT
GUIDEWIRE ZIPWRE .038 STRAIGHT (WIRE) IMPLANT
KIT TURNOVER KIT A (KITS) ×1 IMPLANT
MANIFOLD NEPTUNE II (INSTRUMENTS) ×1 IMPLANT
NS IRRIG 1000ML POUR BTL (IV SOLUTION) IMPLANT
PACK CYSTO (CUSTOM PROCEDURE TRAY) ×1 IMPLANT
PAD PREP 24X48 CUFFED NSTRL (MISCELLANEOUS) ×1 IMPLANT
SHEATH DILATOR SET 8/10 (MISCELLANEOUS) IMPLANT
SHEATH NAVIGATOR HD 11/13X28 (SHEATH) IMPLANT
SHEATH NAVIGATOR HD 11/13X36 (SHEATH) IMPLANT
SHEATH NAVIGATOR HD 12/14X46 (SHEATH) IMPLANT
STENT URET 6FRX26 CONTOUR (STENTS) IMPLANT
TRACTIP FLEXIVA PULS ID 200XHI (Laser) IMPLANT
TUBING CONNECTING 10 (TUBING) ×1 IMPLANT
TUBING UROLOGY SET (TUBING) ×1 IMPLANT

## 2023-12-16 NOTE — H&P (Signed)
 Office Visit Report     11/22/2023   --------------------------------------------------------------------------------   Danny Mendez  MRN: [id number]  DOB: 1965/04/02, 58 year old Male  SSN:    PRIMARY CARE:     REFERRING:  Carlin Griffon, PA  PROVIDER:  Donnice Siad, M.D.  TREATING:  Alan Clois Hammonds, [place]  LOCATION:  [facility] [phone]     --------------------------------------------------------------------------------   CC/HPI: Pt presents today for pre-operative history and physical exam in anticipation of first stage cysto, left ureteroscopy/laser litho/retrograde and stent placement by Dr. Siad on 12/02/23. He is doing well and is without complaint.   Pt denies F/C, HA, CP, SOB, N/V, diarrhea/constipation, back pain, flank pain, hematuria, and dysuria.     HX:    Danny Mendez is a 58 year old male who is seen in followup today for history of urolithiasis.   #1. Urolithiasis:  [id number]: METx3 over a decade ago. History of ureteral stone with provide UTI s/p stent with staged ureteroscopy in 2022.  -CT A/P 10/2021 with nonobstructing bilateral renal stones with 2 stones in the left lower pole measuring 7 mm and 3 mm right interpolar renal stone.  -He developed acute onset right-sided flank pain and presented to urgent care on 09/05/2022. CT A/P demonstrated 4 mm proximal right renal stone with mild right hydronephrosis as well as 8 mm left lower pole stone and adjacent smaller stone.  - He was able to pass a ureteral stone in 6/24.  - He has had intermittent left flank pain. He denies fevers, chills, dysuria.   Last PSA was a couple years ago which was 1.5. He states he is following up with PCP later this year for PSA.   He is from [place].     ALLERGIES:  Niacin - passed out    MEDICATIONS: Rosuvastatin Calcium 10 MG Tablet     GU PSH: Cystoscopy Insert Stent, Left - 2021 Ureteroscopic laser litho, Left - 2022       PSH Notes: Encounter for  contraceptive planning, No Surgical Problems     NON-GU PSH: Visit Complexity (formerly GPC1X) - 10/06/2023     GU PMH: Renal calculus - 10/13/2023, - 10/06/2023, - 10/11/2022, - 09/08/2022, - 2023, A bit difficult to tell today, but he has at least 1, perhaps 2 left lower pole calculi. They do not seem to be larger than CT scan from late 2021. Perhaps a stable right renal stone 3 mm in size., - 2023, Reviewed patient's 24 hour urine with him and recommended limiting sodium and calcium. He was given a printout of these results. In addition he has residual 9 mm left renal calculus in 3 mm right renal calculus. He is interested in proceeding with ESWL. Risks and benefits of the procedure discussed including but not limited to bleeding, infection, failure to break up stone, hematoma, postprocedure HTN, infection, need for future procedure such as stent/URS. Patient will call to schedule when he is ready. , - 2022, Reviewed KUB and renal ultrasound findings with patient. Have recommended continued stone prevention strategies including drinking 2-3 L of water  daily and limiting animal protein and salt. In addition will proceed with 24 hour urine through with a link. We did discuss scheduling ESWL for the left renal calculus have her patient is also had foot surgery recently would like to hold off on any further surgery at the time. , - 2022, Discussed patient's CT scan which show an 8 mm left UPJ calculus with a 9 mm nonobstructing left renal  calculus as well as a 3 mm right nonobstructing renal calculus. We discussed management options for stones including ESWL versus ureteroscopy with laser lithotripsy. Patient would like to have 1 procedure and keeping this in mind we decided to proceed with left ureteroscopy with laser lithotripsy and stent exchange. Due to his foot surgery will postpone this procedure approximately 4 weeks for now. I will refill his pain medication and given oxybutynin  to see if this helps with  bladder urgency and suprapubic pressure. We discussed the risks and benefits of procedure including but not limited to bleeding, pain, infection, stent pain, damage to surrounding structures, need for future treatment. Patient was encouraged to drink 2-3 L of water  a day., - 2021, - 2021, Kidney stone on left side, - 2014 History of urolithiasis - 10/11/2022, stable asymptomatic stones, - 2023, Nephrolithiasis, - 2014 Ureteral calculus - 10/11/2022, - 2022, - 2021, - 2021, Distal Ureteral Stone On The Left, - 2014 Hydronephrosis - 09/08/2022 Flank Pain - 2022      PMH Notes:  1898-03-22 00:00:00 - Note: Normal Routine History And Physical Adult   HLD     NON-GU PMH: None   FAMILY HISTORY: Death In The Family Father - Father Diabetes - Mother Family Health Status Number - Father nephrolithiasis - Father, Mother   SOCIAL HISTORY: Marital Status: Legally Separated Current Smoking Status: Patient does not smoke anymore. Smoked 1 pack per day.  Does not use smokeless tobacco. Has never drank.  Does not use drugs. Drinks 1 caffeinated drink per day. Has not had a blood transfusion.     Notes: r   REVIEW OF SYSTEMS:    GU Review Male:   Patient reports get up at night to urinate. Patient denies frequent urination, hard to postpone urination, burning/ pain with urination, leakage of urine, stream starts and stops, trouble starting your stream, have to strain to urinate , erection problems, and penile pain.  Gastrointestinal (Upper):   Patient denies nausea, vomiting, and indigestion/ heartburn.  Gastrointestinal (Lower):   Patient denies diarrhea and constipation.  Constitutional:   Patient denies fever, night sweats, weight loss, and fatigue.  Skin:   Patient denies skin rash/ lesion and itching.  Eyes:   Patient denies blurred vision and double vision.  Ears/ Nose/ Throat:   Patient denies sore throat and sinus problems.  Hematologic/Lymphatic:   Patient denies easy bruising and swollen  glands.  Cardiovascular:   Patient denies leg swelling and chest pains.  Respiratory:   Patient denies cough and shortness of breath.  Endocrine:   Patient denies excessive thirst.  Musculoskeletal:   Patient denies back pain and joint pain.  Neurological:   Patient denies headaches and dizziness.  Psychologic:   Patient denies depression and anxiety.   VITAL SIGNS:      11/22/2023 03:35 PM  BP 150/95 mmHg  Pulse 60 /min  Temperature 98.4 F / 36.8 C   MULTI-SYSTEM PHYSICAL EXAMINATION:    Constitutional: Well-nourished. No physical deformities. Normally developed. Good grooming.  Neck: Neck symmetrical, not swollen. Normal tracheal position.  Respiratory: Normal breath sounds. No labored breathing, no use of accessory muscles.   Cardiovascular: Regular rate and rhythm. No murmur, no gallop  Lymphatic: No enlargement of neck, axillae, groin.  Skin: No paleness, no jaundice, no cyanosis. No lesion, no ulcer, no rash.  Neurologic / Psychiatric: Oriented to time, oriented to place, oriented to person. No depression, no anxiety, no agitation.  Gastrointestinal: No mass, no tenderness, no rigidity, non obese abdomen.  Eyes: Normal conjunctivae. Normal eyelids.  Ears, Nose, Mouth, and Throat: Left ear no scars, no lesions, no masses. Right ear no scars, no lesions, no masses. Nose no scars, no lesions, no masses. Normal hearing. Normal lips.  Musculoskeletal: Normal gait and station of head and neck.     Complexity of Data:  Records Review:   Previous Patient Records  Urine Test Review:   Urinalysis   11/22/23  Urinalysis  Urine Appearance Clear   Urine Color Yellow   Urine Glucose Neg mg/dL  Urine Bilirubin Neg mg/dL  Urine Ketones Neg mg/dL  Urine Specific Gravity 1.010   Urine Blood Neg ery/uL  Urine pH 6.5   Urine Protein Trace mg/dL  Urine Urobilinogen 0.2 mg/dL  Urine Nitrites Neg   Urine Leukocyte Esterase Neg leu/uL   PROCEDURES:          Urinalysis - 81003 Dipstick  Dipstick Cont'd  Color: Yellow Bilirubin: Neg mg/dL  Appearance: Clear Ketones: Neg mg/dL  Specific Gravity: 8.989 Blood: Neg ery/uL  pH: 6.5 Protein: Trace mg/dL  Glucose: Neg mg/dL Urobilinogen: 0.2 mg/dL    Nitrites: Neg    Leukocyte Esterase: Neg leu/uL    ASSESSMENT:      ICD-10 Details  1 GU:   Renal calculus - N20.0    PLAN:           Schedule Return Visit/Planned Activity: Keep Scheduled Appointment - Schedule Surgery          Document Letter(s):  Created for Patient: Clinical Summary         Notes:   There are no changes in the patients history or physical exam since last evaluation by Dr. Selma. Pt is scheduled to undergo cysto, left ureteroscopy/laser litho/retrograde and stent on 12/02/23.   All pt's questions were answered to the best of my ability.    Urology Preoperative H&P   Chief Complaint: left renal stones  History of Present Illness: Danny Mendez is a 58 y.o. male with left renal stones here for second stage left URS/LL. Denies recent fevers, chills, dysuria. He does not want me to remove his lesion on his suprapubic area and states he follows with a dermatologist annually to monitor this lesion.    Past Medical History:  Diagnosis Date   Cast in place on lower extremity 03/07/2020   using crutches   GERD (gastroesophageal reflux disease)    History of kidney stones    Hypertension    no medications   Renal disorder    Sleep apnea    no CPAP,  mild OSA   Wears glasses     Past Surgical History:  Procedure Laterality Date   CYSTOSCOPY W/ RETROGRADES Left 12/02/2023   Procedure: CYSTOSCOPY, WITH RETROGRADE PYELOGRAM;  Surgeon: Danny Donnice SAUNDERS, MD;  Location: [facility];  Service: Urology;  Laterality: Left;   CYSTOSCOPY WITH RETROGRADE PYELOGRAM, URETEROSCOPY AND STENT PLACEMENT Left 02/21/2020   Procedure: CYSTOSCOPY WITH RETROGRADE PYELOGRAM AND STENT PLACEMENT;  Surgeon: Rosalind Zachary NOVAK, MD;  Location: [facility];  Service: Urology;  Laterality: Left;    CYSTOSCOPY/URETEROSCOPY/HOLMIUM LASER/STENT PLACEMENT Left 03/25/2020   Procedure: CYSTOSCOPY LEFT URETEROSCOPY/HOLMIUM LASER/STENT EXCHANGE STONE BASKET EXTRACTION ;  Surgeon: Elisabeth Valli BIRCH, MD;  Location: [facility];  Service: Urology;  Laterality: Left;  REQUESTING 90 MINS   CYSTOSCOPY/URETEROSCOPY/HOLMIUM LASER/STENT PLACEMENT Left 12/02/2023   Procedure: CYSTOSCOPY/URETEROSCOPY/HOLMIUM LASER/STENT PLACEMENT;  Surgeon: Danny Donnice SAUNDERS, MD;  Location: [facility];  Service: Urology;  Laterality: Left;  FIRST STAGE  FOOT SURGERY Left 03/07/2020   torn tendons, and bone spur removal   TONSILLECTOMY  as child    Allergies:  Allergies  Allergen Reactions   Niacin And Related Other (See Comments)    Pt. Stated, makes me pass out.    History reviewed. No pertinent family history.  Social History:  reports that he quit smoking about 17 years ago. His smoking use included cigarettes. He started smoking about 32 years ago. He has a 15 pack-year smoking history. He has never used smokeless tobacco. He reports that he does not drink alcohol and does not use drugs.  ROS: A complete review of systems was performed.  All systems are negative except for pertinent findings as noted.  Physical Exam:  Vital signs in last 24 hours: Temp:  [98.3 F (36.8 C)] 98.3 F (36.8 C) (09/26 0620) Pulse Rate:  [77] 77 (09/26 0620) Resp:  [16] 16 (09/26 0620) BP: (131)/(88) 131/88 (09/26 0620) SpO2:  [97 %] 97 % (09/26 0620) Weight:  [89.4 kg] 89.4 kg (09/26 0700) Constitutional:  Alert and oriented, No acute distress Cardiovascular: Regular rate and rhythm Respiratory: Normal respiratory effort, Lungs clear bilaterally GI: Abdomen is soft, nontender, nondistended, no abdominal masses GU: No CVA tenderness Lymphatic: No lymphadenopathy Neurologic: Grossly intact, no focal deficits Psychiatric: Normal mood and affect  Laboratory Data:  No results for input(s): WBC, HGB, HCT,  PLT in the last 72 hours.  No results for input(s): NA, K, CL, GLUCOSE, BUN, CALCIUM, CREATININE in the last 72 hours.  Invalid input(s): CO3   No results found for this or any previous visit (from the past 24 hours). No results found for this or any previous visit (from the past 240 hours).  Renal Function: No results for input(s): CREATININE in the last 168 hours. Estimated Creatinine Clearance: 84.5 mL/min (by C-G formula based on SCr of 1.08 mg/dL).  Radiologic Imaging: No results found.  I independently reviewed the above imaging studies.  Assessment and Plan Danny Mendez is a 58 y.o. male with left renal stones here for second stage left URS/LL.  -The risks, benefits and alternatives of cystoscopy with left URS/LL JJ stent placement was discussed with the patient.  Risks include, but are not limited to: bleeding, urinary tract infection, ureteral injury, ureteral stricture disease, chronic pain, urinary symptoms, bladder injury, stent migration, the need for nephrostomy tube placement, MI, CVA, DVT, PE and the inherent risks with general anesthesia.  The patient voices understanding and wishes to proceed.       Matt R. Chrystal Zeimet MD 12/16/2023, 7:03 AM  [facility] Pager: [phone]): [phone]

## 2023-12-16 NOTE — Anesthesia Postprocedure Evaluation (Signed)
 Anesthesia Post Note  Patient: Cru Petraitis  Procedure(s) Performed: CYSTOSCOPY/URETEROSCOPY/HOLMIUM LASER/STENT PLACEMENT (Left: Ureter) CYSTOSCOPY, WITH RETROGRADE PYELOGRAM (Left: Ureter)     Patient location during evaluation: PACU Anesthesia Type: General Level of consciousness: awake and alert, oriented and patient cooperative Pain management: pain level controlled Vital Signs Assessment: post-procedure vital signs reviewed and stable Respiratory status: spontaneous breathing, nonlabored ventilation and respiratory function stable Cardiovascular status: blood pressure returned to baseline and stable Postop Assessment: no apparent nausea or vomiting Anesthetic complications: no   No notable events documented.  Last Vitals:  Vitals:   12/16/23 0927 12/16/23 0930  BP: (!) 134/91 136/88  Pulse: 66 65  Resp: 18 14  Temp:  36.7 C  SpO2: 99% 99%    Last Pain:  Vitals:   12/16/23 0930  TempSrc:   PainSc: 0-No pain                 Almarie CHRISTELLA Marchi

## 2023-12-16 NOTE — Anesthesia Procedure Notes (Signed)
 Procedure Name: LMA Insertion Date/Time: 12/16/2023 7:59 AM  Performed by: Carleton Garnette SAUNDERS, CRNAPre-anesthesia Checklist: Patient identified, Emergency Drugs available, Patient being monitored, Suction available and Timeout performed Patient Re-evaluated:Patient Re-evaluated prior to induction Oxygen Delivery Method: Circle system utilized Preoxygenation: Pre-oxygenation with 100% oxygen Induction Type: IV induction LMA: LMA inserted LMA Size: 4.0 Tube type: Oral Number of attempts: 1 Placement Confirmation: positive ETCO2 and breath sounds checked- equal and bilateral Tube secured with: Tape Dental Injury: Teeth and Oropharynx as per pre-operative assessment

## 2023-12-16 NOTE — Discharge Instructions (Addendum)
 [facility] [phone] Post Ureteroscopy With or Without Stent Instructions  Definitions:  Ureter: The duct that transports urine from the kidney to the bladder. Stent:   A plastic hollow tube that is placed into the ureter, from the kidney to the bladder to prevent the ureter from swelling shut.  GENERAL INSTRUCTIONS:  Despite the fact that no skin incisions were used, the area around the ureter and bladder is raw and irritated. The stent is a foreign body which will further irritate the bladder wall. This irritation is manifested by increased frequency of urination, both day and night, and by an increase in the urge to urinate. In some, the urge to urinate is present almost always. Sometimes the urge is strong enough that you may not be able to stop yourself from urinating. The only real cure is to remove the stent and then give time for the bladder wall to heal which can't be done until the danger of the ureter swelling shut has passed, which varies.  You may see some blood in your urine while the stent is in place and a few days afterwards. Do not be alarmed, even if the urine was clear for a while. Get off your feet and drink lots of fluids until clearing occurs. If you start to pass clots or don't improve, call us .  DIET: You may return to your normal diet immediately. Because of the raw surface of your bladder, alcohol, spicy foods, acid type foods and drinks with caffeine may cause irritation or frequency and should be used in moderation. To keep your urine flowing freely and to avoid constipation, drink plenty of fluids during the day ( 8-10 glasses ). Tip: Avoid cranberry juice because it is very acidic.  ACTIVITY: Your physical activity doesn't need to be restricted. However, if you are very active, you may see some blood in your urine. We suggest that you reduce your activity under these circumstances until the bleeding has stopped.  BOWELS: It is important to  keep your bowels regular during the postoperative period. Straining with bowel movements can cause bleeding. A bowel movement every other day is reasonable. Use a mild laxative if needed, such as Milk of Magnesia 2-3 tablespoons, or 2 Dulcolax tablets. Call if you continue to have problems. If you have been taking narcotics for pain, before, during or after your surgery, you may be constipated. Take a laxative if necessary.   MEDICATION: You should resume your pre-surgery medications unless told not to. In addition you will often be given an antibiotic to prevent infection. These should be taken as prescribed until the bottles are finished unless you are having an unusual reaction to one of the drugs.  PROBLEMS YOU SHOULD REPORT TO US : Fevers over 100.5 Fahrenheit. Heavy bleeding, or clots ( See above notes about blood in urine ). Inability to urinate. Drug reactions ( hives, rash, nausea, vomiting, diarrhea ). Severe burning or pain with urination that is not improving.  FOLLOW-UP: You will need a follow-up appointment to monitor your progress. Call for this appointment at the number listed above. Usually the first appointment will be about three to fourteen days after your surgery.  You have a stent draining your kidney.  This will be reviewed in the office.  The office will call to follow-up.

## 2023-12-16 NOTE — Transfer of Care (Signed)
 Immediate Anesthesia Transfer of Care Note  Patient: Danny Mendez  Procedure(s) Performed: CYSTOSCOPY/URETEROSCOPY/HOLMIUM LASER/STENT PLACEMENT (Left) CYSTOSCOPY, WITH RETROGRADE PYELOGRAM (Left)  Patient Location: PACU  Anesthesia Type:General  Level of Consciousness: sedated  Airway & Oxygen Therapy: Patient Spontanous Breathing and Patient connected to face mask oxygen  Post-op Assessment: Report given to RN and Post -op Vital signs reviewed and stable  Post vital signs: Reviewed and stable  Last Vitals:  Vitals Value Taken Time  BP 112/82 12/16/23 08:49  Temp    Pulse 62 12/16/23 08:50  Resp 12 12/16/23 08:50  SpO2 99 % 12/16/23 08:50  Vitals shown include unfiled device data.  Last Pain:  Vitals:   12/16/23 0700  TempSrc:   PainSc: 0-No pain         Complications: No notable events documented.

## 2023-12-16 NOTE — Op Note (Signed)
 Operative Note  Preoperative diagnosis:  1.  Left renal stone  Postoperative diagnosis: 1.  Left renal stone  Procedure(s): 1.  Cystoscopy 2.  Second stage left ureteroscopy with laser lithotripsy and basket extraction of stones 3.  Left retrograde pyelogram 4.  Left ureteral stent placement 5. Fluoroscopy with intraoperative interpretation  Surgeon: Donnice Siad, MD  Assistants:  None  Anesthesia:  General  Complications:  None  EBL:  Minimal  Specimens: 1. Stones for stone analysis (to be done at [facility])  Drains/Catheters: 1.  Left 6Fr x 26cm ureteral stent without a tether string  Intraoperative findings:   Cystoscopy demonstrated no suspicious bladder lesions. Left retrograde pyelogram demonstrated no hydronephrosis, no extravasation of contrast and no filling defects. Left ureteroscopy demonstrated several large left lower pole fragments that were fragmented and then basket extracted in their entirety.  No stone residual within the case. Successful stent placement.  Indication:  Danny Mendez is a 58 y.o. male with with history of urolithiasis including large left lower pole stones.  He presents today for second stage left ureteroscopy with laser lithotripsy and basket traction of stones.  He did have a suprapubic warty appearing lesion and did not want this excised stating that he follows with a dermatologist annually where this is being monitored.  Description of procedure: After informed consent was obtained from the patient, the patient was identified and taken to the operating room and placed in the supine position.  General anesthesia was administered as well as perioperative IV antibiotics.  At the beginning of the case, a time-out was performed to properly identify the patient, the surgery to be performed, and the surgical site.  Sequential compression devices were applied to the lower extremities at the beginning of the case for DVT prophylaxis.  The  patient was then placed in the dorsal lithotomy supine position, prepped and draped in sterile fashion.  We then passed the 21-French rigid cystoscope through the urethra and into the bladder under vision without any difficulty, noting a normal urethra without strictures and a mildly obstructing prostate.  A systematic evaluation of the bladder revealed no evidence of any suspicious bladder lesions.  Ureteral orifices were in normal position.    The distal aspect of the ureteral stent was seen protruding from the left ureteral orifice.  We then used the alligator-tooth forceps and grasped the distal end of the ureteral stent and brought it out the urethral meatus while watching the proximal coil straighten out nicely on fluoroscopy. Through the ureteral stent, we then passed a 0.038 sensor wire up to the level of the renal pelvis.  The ureteral stent was then removed, leaving the sensor wire up the left ureter.    Under cystoscopic and flouroscopic guidance, we cannulated the left ureteral orifice with a 5-French open-ended ureteral catheter and a gentle retrograde pyelogram was performed, revealing a normal caliber ureter without any filling defects. There was no hydronephrosis of the collecting system. A 0.038 sensor wire was then passed up to the level of the renal pelvis and secured to the drape as a safety wire. The ureteral catheter and cystoscope were removed, leaving the safety wire in place.   A semi-rigid ureteroscope was passed alongside the wire up the distal ureter which appeared normal. A second 0.038 sensor wire was passed under direct vision and the semirigid scope was removed.  A 12/14 Fr ureteral access sheath was carefully advanced up the ureter to the level of the UPJ over this wire under fluoroscopic  guidance. The flexible ureteroscope was advanced into the collecting system via the access sheath. The collecting system was inspected. The calculus was identified at the left lower pole.   There were several large fragments. Using the 272 micron holmium laser fiber, the stones were fragmented completely. A 2.2 Fr zero tip basket was used to remove the fragments under visual guidance. These were sent for chemical analysis. With the ureteroscope in the kidney, a gentle pyelogram was performed to delineate the calyceal system and we evaluated the calyces systematically. We encountered no further large stone fragments.  The remaining pieces were dust, small and the tip of the laser fiber. The rest of the stone fragments were very tiny and these were  irrigated away gently. The calyces were re-inspected and there were no significant stone fragment residual.   We then withdrew the ureteroscope back down the ureter along with the access sheath, noting no evidence of any stones along the course of the ureter.  Prior to removing the ureteroscope, we did pass the Glidewire back up to the ureter to the renal pelvis.   Once the ureteroscope was removed, the Glidewire was backloaded through the rigid cystoscope, which was then advanced down the urethra and into the bladder. We then used the Glidewire under direct vision through the rigid cystoscope and under fluoroscopic guidance and passed up a 6-French, 26 cm double-pigtail ureteral stent up ureter, making sure that the proximal and distal ends coiled within the kidney and bladder respectively.  I did not leave a tether string. We were able to see the distal stent coiling nicely within the bladder.  The bladder was then emptied with irrigation solution.  The cystoscope was then removed.    The patient tolerated the procedure well and there was no complication. Patient was awoken from anesthesia and taken to the recovery room in stable condition. I was present and scrubbed for the entirety of the case.  Plan:  Patient will be discharged home.  Follow up with me in 7 to 10 days for stent removal in the office.  Matt R. Tonya Carlile MD [facility]  Pager:  [phone]

## 2023-12-17 ENCOUNTER — Encounter (HOSPITAL_COMMUNITY): Payer: Self-pay | Admitting: Urology

## 2024-01-09 ENCOUNTER — Encounter (INDEPENDENT_AMBULATORY_CARE_PROVIDER_SITE_OTHER): Payer: Self-pay
